# Patient Record
Sex: Female | Born: 1995
Health system: Southern US, Community
[De-identification: ages and names within clinical notes are randomized; demographics above are authoritative.]

## PROBLEM LIST (undated history)

## (undated) DIAGNOSIS — IMO0001 Reserved for inherently not codable concepts without codable children: Secondary | ICD-10-CM

## (undated) DIAGNOSIS — O09299 Supervision of pregnancy with other poor reproductive or obstetric history, unspecified trimester: Secondary | ICD-10-CM

## (undated) HISTORY — DX: Supervision of pregnancy with other poor reproductive or obstetric history, unspecified trimester: O09.299

---
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
## 2015-02-09 NOTE — L&D Delivery Note (Signed)
Delivery Note At 11:33 PM a viable female was delivered via Vaginal, Spontaneous Delivery (Presentation: ;  ).  APGAR: 6, 7; weight 4 lb 6.2 oz (1990 g).   Placenta status: Intact, Spontaneous.  Cord: 3 vessels with the following complications: None.  Cord pH: pending  Anesthesia: None  Episiotomy: None Lacerations: Sulcus, bilateral, with bilateral labial extensions Suture Repair: 3.0 monocryl Est. Blood Loss (mL): 750, IM methergine and SL and 800 micrograms total buccal cytotec   Mom to postpartum. Magnesium sulfate for 24 hours  Baby to NICU.  EURE,LUTHER H 07/14/2015, 12:35 AM

## 2015-02-17 ENCOUNTER — Other Ambulatory Visit (HOSPITAL_COMMUNITY): Payer: Self-pay | Admitting: Nurse Practitioner

## 2015-02-17 DIAGNOSIS — Z369 Encounter for antenatal screening, unspecified: Secondary | ICD-10-CM

## 2015-02-17 LAB — OB RESULTS CONSOLE RPR: RPR: NONREACTIVE

## 2015-02-17 LAB — OB RESULTS CONSOLE HIV ANTIBODY (ROUTINE TESTING): HIV: NONREACTIVE

## 2015-02-17 LAB — OB RESULTS CONSOLE GC/CHLAMYDIA
Chlamydia: NEGATIVE
Gonorrhea: NEGATIVE

## 2015-02-17 LAB — OB RESULTS CONSOLE RUBELLA ANTIBODY, IGM: Rubella: IMMUNE

## 2015-02-17 LAB — OB RESULTS CONSOLE VARICELLA ZOSTER ANTIBODY, IGG: Varicella: IMMUNE

## 2015-02-18 ENCOUNTER — Ambulatory Visit (HOSPITAL_COMMUNITY)
Admission: RE | Admit: 2015-02-18 | Discharge: 2015-02-18 | Disposition: A | Discharge status: Home / Self Care | Payer: Medicaid Other | Source: Ambulatory Visit | Attending: Nurse Practitioner | Admitting: Nurse Practitioner

## 2015-02-18 ENCOUNTER — Ambulatory Visit (HOSPITAL_COMMUNITY): Payer: Medicaid Other

## 2015-02-18 ENCOUNTER — Other Ambulatory Visit (HOSPITAL_COMMUNITY): Payer: Self-pay | Admitting: Nurse Practitioner

## 2015-02-18 ENCOUNTER — Encounter (HOSPITAL_COMMUNITY): Payer: Self-pay

## 2015-02-18 DIAGNOSIS — Z3A13 13 weeks gestation of pregnancy: Secondary | ICD-10-CM

## 2015-02-18 DIAGNOSIS — Z369 Encounter for antenatal screening, unspecified: Secondary | ICD-10-CM

## 2015-02-18 DIAGNOSIS — O34219 Maternal care for unspecified type scar from previous cesarean delivery: Secondary | ICD-10-CM

## 2015-02-18 DIAGNOSIS — Z36 Encounter for antenatal screening of mother: Secondary | ICD-10-CM | POA: Insufficient documentation

## 2015-02-18 HISTORY — DX: Reserved for inherently not codable concepts without codable children: IMO0001

## 2015-02-21 ENCOUNTER — Ambulatory Visit (HOSPITAL_COMMUNITY): Payer: Medicaid Other

## 2015-03-04 ENCOUNTER — Other Ambulatory Visit (HOSPITAL_COMMUNITY): Payer: Self-pay

## 2015-07-02 ENCOUNTER — Inpatient Hospital Stay (HOSPITAL_COMMUNITY)
Admission: AD | Admit: 2015-07-02 | Discharge: 2015-07-02 | Disposition: A | Discharge status: Home / Self Care | Payer: Medicaid Other | Source: Ambulatory Visit | Attending: Obstetrics & Gynecology | Admitting: Obstetrics & Gynecology

## 2015-07-02 ENCOUNTER — Encounter (HOSPITAL_COMMUNITY): Payer: Self-pay | Admitting: *Deleted

## 2015-07-02 DIAGNOSIS — R1110 Vomiting, unspecified: Secondary | ICD-10-CM | POA: Diagnosis present

## 2015-07-02 DIAGNOSIS — O479 False labor, unspecified: Secondary | ICD-10-CM

## 2015-07-02 DIAGNOSIS — R109 Unspecified abdominal pain: Secondary | ICD-10-CM | POA: Diagnosis present

## 2015-07-02 DIAGNOSIS — R740 Nonspecific elevation of levels of transaminase and lactic acid dehydrogenase [LDH]: Secondary | ICD-10-CM

## 2015-07-02 DIAGNOSIS — M549 Dorsalgia, unspecified: Secondary | ICD-10-CM | POA: Insufficient documentation

## 2015-07-02 DIAGNOSIS — O9989 Other specified diseases and conditions complicating pregnancy, childbirth and the puerperium: Secondary | ICD-10-CM

## 2015-07-02 DIAGNOSIS — O26893 Other specified pregnancy related conditions, third trimester: Secondary | ICD-10-CM | POA: Insufficient documentation

## 2015-07-02 DIAGNOSIS — Z3A32 32 weeks gestation of pregnancy: Secondary | ICD-10-CM | POA: Diagnosis not present

## 2015-07-02 DIAGNOSIS — R7401 Elevation of levels of liver transaminase levels: Secondary | ICD-10-CM

## 2015-07-02 LAB — URINALYSIS, ROUTINE W REFLEX MICROSCOPIC
BILIRUBIN URINE: NEGATIVE
Glucose, UA: NEGATIVE mg/dL
HGB URINE DIPSTICK: NEGATIVE
Ketones, ur: NEGATIVE mg/dL
Nitrite: NEGATIVE
PH: 7 (ref 5.0–8.0)
Protein, ur: NEGATIVE mg/dL
SPECIFIC GRAVITY, URINE: 1.025 (ref 1.005–1.030)

## 2015-07-02 LAB — CBC WITH DIFFERENTIAL/PLATELET
BASOS ABS: 0 10*3/uL (ref 0.0–0.1)
Basophils Relative: 0 %
Eosinophils Absolute: 0.1 10*3/uL (ref 0.0–0.7)
Eosinophils Relative: 1 %
HEMATOCRIT: 34.2 % — AB (ref 36.0–46.0)
Hemoglobin: 11.5 g/dL — ABNORMAL LOW (ref 12.0–15.0)
LYMPHS PCT: 17 %
Lymphs Abs: 2.1 10*3/uL (ref 0.7–4.0)
MCH: 31.6 pg (ref 26.0–34.0)
MCHC: 33.6 g/dL (ref 30.0–36.0)
MCV: 94 fL (ref 78.0–100.0)
Monocytes Absolute: 0.6 10*3/uL (ref 0.1–1.0)
Monocytes Relative: 5 %
NEUTROS ABS: 9.8 10*3/uL — AB (ref 1.7–7.7)
NEUTROS PCT: 77 %
Platelets: 151 10*3/uL (ref 150–400)
RBC: 3.64 MIL/uL — AB (ref 3.87–5.11)
RDW: 13.6 % (ref 11.5–15.5)
WBC: 12.7 10*3/uL — AB (ref 4.0–10.5)

## 2015-07-02 LAB — COMPREHENSIVE METABOLIC PANEL
ALT: 47 U/L (ref 14–54)
AST: 67 U/L — AB (ref 15–41)
Albumin: 3.2 g/dL — ABNORMAL LOW (ref 3.5–5.0)
Alkaline Phosphatase: 107 U/L (ref 38–126)
Anion gap: 9 (ref 5–15)
BILIRUBIN TOTAL: 0.6 mg/dL (ref 0.3–1.2)
BUN: 5 mg/dL — AB (ref 6–20)
CHLORIDE: 107 mmol/L (ref 101–111)
CO2: 21 mmol/L — ABNORMAL LOW (ref 22–32)
CREATININE: 0.43 mg/dL — AB (ref 0.44–1.00)
Calcium: 8.8 mg/dL — ABNORMAL LOW (ref 8.9–10.3)
GFR calc Af Amer: >60 mL/min (ref >60)
Glucose, Bld: 84 mg/dL (ref 65–99)
Potassium: 4 mmol/L (ref 3.5–5.1)
Sodium: 137 mmol/L (ref 135–145)
Total Protein: 6.4 g/dL — ABNORMAL LOW (ref 6.5–8.1)

## 2015-07-02 LAB — URINE MICROSCOPIC-ADD ON: RBC / HPF: NONE SEEN RBC/hpf (ref 0–5)

## 2015-07-02 LAB — LIPASE, BLOOD: LIPASE: 14 U/L (ref 11–51)

## 2015-07-02 NOTE — MAU Note (Signed)
Back pain started last night.  Still present and worse this morning.  abd started hurting this morning, baby won't stop moving, belly is hard.  Vomited once.

## 2015-07-02 NOTE — MAU Provider Note (Signed)
MAU HISTORY AND PHYSICAL  Chief Complaint:  Back Pain; Abdominal Pain; and Emesis   Danielle Singleton is a 20 y.o.  G2P0101 with IUP at [redacted]w[redacted]d presenting for Back Pain since 6 pm; Abdominal Pain since 5 am this morning; and Emesis at 7:30 am.   Back pain: for about 15 hours now. She was sleeping when she started feeling the pain. Pain is dull, on both side and radiating down to the back of her thighs. Pain is constant and about 6/10. No alleviating factor.  Abdominal pain: resolved now. Similar to her previous pain but on bothside of her upper abdomen this time. Pain is sharp.  Emesis: one episode this morning. It is yellowish without blood. About one tablespoonful. Hasn't eaten this morning. Says she don't have appetite. She also reports mild nausea Denies fever, shortness of breath, headache, vision changes, dysuria, vaginal discharge, bleeding, gush or leak of fluid.  Baby is moving more since this morning. She goes to [facility] for prenatal care. No concern about pregnancy. Prenatal documents reviewed and no concern.  PMH: negative.   OB History: PIH, C/S at 36 weeks for breach Denies smoking, drinking EtOH or using drugs  Past Medical History  Diagnosis Date  . PIH (pregnancy induced hypertension), previous postpartum condition     Past Surgical History  Procedure Laterality Date  . Cesarean section      History reviewed. No pertinent family history.  Social History  Substance Use Topics  . Smoking status: Never Smoker   . Smokeless tobacco: None  . Alcohol Use: None    No Known Allergies  Prescriptions prior to admission  Medication Sig Dispense Refill Last Dose  . Prenatal Vit-Fe Fumarate-FA (PRENATAL VITAMIN PO) Take by mouth.   Taking    Review of Systems - Negative except for what is mentioned in HPI.  Physical Exam  Blood pressure 126/61, pulse 72, temperature 98.5 F (36.9 C), temperature source Oral, resp. rate 16, weight 189 lb 9.6 oz (86.002 kg), last menstrual  period 11/17/2014. GENERAL: Well-developed, well-nourished female in no acute distress.  LUNGS: Clear to auscultation bilaterally.  HEART: Regular rate and rhythm. ABDOMEN: Soft, nontender, nondistended, gravid.  EXTREMITIES: Nontender, no edema, 2+ distal pulses. Cervical Exam: deferred Presentation: deferred FHT:  140 BPM   Labs: Results for orders placed or performed during the hospital encounter of 07/02/15 (from the past 24 hour(s))  Urinalysis, Routine w reflex microscopic (not at [facility])   Collection Time: 07/02/15  8:20 AM  Result Value Ref Range   Color, Urine YELLOW YELLOW   APPearance CLEAR CLEAR   Specific Gravity, Urine 1.025 1.005 - 1.030   pH 7.0 5.0 - 8.0   Glucose, UA NEGATIVE NEGATIVE mg/dL   Hgb urine dipstick NEGATIVE NEGATIVE   Bilirubin Urine NEGATIVE NEGATIVE   Ketones, ur NEGATIVE NEGATIVE mg/dL   Protein, ur NEGATIVE NEGATIVE mg/dL   Nitrite NEGATIVE NEGATIVE   Leukocytes, UA TRACE (A) NEGATIVE  Urine microscopic-add on   Collection Time: 07/02/15  8:20 AM  Result Value Ref Range   Squamous Epithelial / LPF 6-30 (A) NONE SEEN   WBC, UA 0-5 0 - 5 WBC/hpf   RBC / HPF NONE SEEN 0 - 5 RBC/hpf   Bacteria, UA MANY (A) NONE SEEN   Urine-Other MUCOUS PRESENT     Imaging Studies:  No results found.  Assessment: Danielle Singleton is  20 y.o. G2P0101 at [redacted]w[redacted]d presents with back pain, abdominal pain and one episode of emesis. She at risk for  PIH given history of this with previous pregnancy. However, BP within normal range. She is without severe features. Abdominal pain has resolved now. Labs are negative except for mildly elevated AST to 67 which could also be due to emesis. Others on differential are choleastasis but patient without itching. AlkPhos within normal range as well. Low suspicion for acute hepatitis but can not rule out. Unlikely pancreatitis with normal lipase. UA with many bacteria but also with a lot of squamous cells. No vaginal discharge to think of  vaginosis. She is now without abdominal pain to think of early labor. No vaginal bleeding or gush of fluid either.  Plan: Discharge home with follow up at [facility]. She says she has an appointment on 07/08/2015. Discussed return precautions including worsening of pain, itching, yellowish discoloration of skin, severe emesis, regular contraction, vaginal bleeding, leaking of fluid, decreased fetal movement, fever or feeling sick Hepatitis panel pending at the time of discharge. Advised to keep herself hydrated Recommened tylenol as needed and icy hot for pain  Taye T Gonfa 5/24/20179:08 AM  OB FELLOW MAU DISCHARGE ATTESTATION  I have seen and examined this patient; I agree with above documentation in the resident's note. Symptoms resolved by the time she arrived to [facility]. NST reviewed with Dr. Roselie Awkward. Reactive and OK to d/c. Mild AST elevation, hepatitis panel ordered. No pruritus so have held on ordering bile acids. No other lab values to suggest preE or hellp, and patient normotensive and asymptomatic. No ruq pain to suggest cholecystitis. Elevation is mild, do not think this represents acute hepatitis. Ob f/u next week as scheduled. Given preE and cholestasis return precautions   Desma Maxim, MD 12:33 PM

## 2015-07-02 NOTE — Discharge Instructions (Signed)
It was great seeing you today! We have addressed the following issues today   Abdominal pain: likely from your pregnancy. Doubt any serious thing right now. You can come back if you have worsening of pain, itching, yellowish discoloration of skin, severe headache, vision changes, vaginal bleeding, burning with urination, baby not moving as usual or not able to keep food or fluid down, fever or other concerning symptoms. Please, keep yourself well hydrated. We have done some tests. We will notify your if there is a concerning finding.   Back pain: you can try tylenol. This available over the counter. You can also try icy hot.   Take Care,    Braxton Hicks Contractions Contractions of the uterus can occur throughout pregnancy. Contractions are not always a sign that you are in labor.  WHAT ARE BRAXTON HICKS CONTRACTIONS?  Contractions that occur before labor are called Braxton Hicks contractions, or false labor. Toward the end of pregnancy (32-34 weeks), these contractions can develop more often and may become more forceful. This is not true labor because these contractions do not result in opening (dilatation) and thinning of the cervix. They are sometimes difficult to tell apart from true labor because these contractions can be forceful and people have different pain tolerances. You should not feel embarrassed if you go to the hospital with false labor. Sometimes, the only way to tell if you are in true labor is for your health care provider to look for changes in the cervix. If there are no prenatal problems or other health problems associated with the pregnancy, it is completely safe to be sent home with false labor and await the onset of true labor. HOW CAN YOU TELL THE DIFFERENCE BETWEEN TRUE AND FALSE LABOR? False Labor  The contractions of false labor are usually shorter and not as hard as those of true labor.   The contractions are usually irregular.   The contractions are often felt  in the front of the lower abdomen and in the groin.   The contractions may go away when you walk around or change positions while lying down.   The contractions get weaker and are shorter lasting as time goes on.   The contractions do not usually become progressively stronger, regular, and closer together as with true labor.  True Labor  Contractions in true labor last 30-70 seconds, become very regular, usually become more intense, and increase in frequency.   The contractions do not go away with walking.   The discomfort is usually felt in the top of the uterus and spreads to the lower abdomen and low back.   True labor can be determined by your health care provider with an exam. This will show that the cervix is dilating and getting thinner.  WHAT TO REMEMBER  Keep up with your usual exercises and follow other instructions given by your health care provider.   Take medicines as directed by your health care provider.   Keep your regular prenatal appointments.   Eat and drink lightly if you think you are going into labor.   If Braxton Hicks contractions are making you uncomfortable:   Change your position from lying down or resting to walking, or from walking to resting.   Sit and rest in a tub of warm water.   Drink 2-3 glasses of water. Dehydration may cause these contractions.   Do slow and deep breathing several times an hour.  WHEN SHOULD I SEEK IMMEDIATE MEDICAL CARE? Seek immediate medical care  if:  Your contractions become stronger, more regular, and closer together.   You have fluid leaking or gushing from your vagina.   You have a fever.   You pass blood-tinged mucus.   You have vaginal bleeding.   You have continuous abdominal pain.   You have low back pain that you never had before.   You feel your baby's head pushing down and causing pelvic pressure.   Your baby is not moving as much as it used to.    Back Pain in  Pregnancy Back pain during pregnancy is common. It happens in about half of all pregnancies. It is important for you and your baby that you remain active during your pregnancy.If you feel that back pain is not allowing you to remain active or sleep well, it is time to see your caregiver. Back pain may be caused by several factors related to changes during your pregnancy.Fortunately, unless you had trouble with your back before your pregnancy, the pain is likely to get better after you deliver. Low back pain usually occurs between the fifth and seventh months of pregnancy. It can, however, happen in the first couple months. Factors that increase the risk of back problems include:   Previous back problems.  Injury to your back.  Having twins or multiple births.  A chronic cough.  Stress.  Job-related repetitive motions.  Muscle or spinal disease in the back.  Family history of back problems, ruptured (herniated) discs, or osteoporosis.  Depression, anxiety, and panic attacks. CAUSES   When you are pregnant, your body produces a hormone called relaxin. This hormonemakes the ligaments connecting the low back and pubic bones more flexible. This flexibility allows the baby to be delivered more easily. When your ligaments are loose, your muscles need to work harder to support your back. Soreness in your back can come from tired muscles. Soreness can also come from back tissues that are irritated since they are receiving less support.  As the baby grows, it puts pressure on the nerves and blood vessels in your pelvis. This can cause back pain.  As the baby grows and gets heavier during pregnancy, the uterus pushes the stomach muscles forward and changes your center of gravity. This makes your back muscles work harder to maintain good posture. SYMPTOMS  Lumbar pain during pregnancy Lumbar pain during pregnancy usually occurs at or above the waist in the center of the back. There may be pain  and numbness that radiates into your leg or foot. This is similar to low back pain experienced by non-pregnant women. It usually increases with sitting for long periods of time, standing, or repetitive lifting. Tenderness may also be present in the muscles along your upper back. Posterior pelvic pain during pregnancy Pain in the back of the pelvis is more common than lumbar pain in pregnancy. It is a deep pain felt in your side at the waistline, or across the tailbone (sacrum), or in both places. You may have pain on one or both sides. This pain can also go into the buttocks and backs of the upper thighs. Pubic and groin pain may also be present. The pain does not quickly resolve with rest, and morning stiffness may also be present. Pelvic pain during pregnancy can be brought on by most activities. A high level of fitness before and during pregnancy may or may not prevent this problem. Labor pain is usually 1 to 2 minutes apart, lasts for about 1 minute, and involves a bearing down  feeling or pressure in your pelvis. However, if you are at term with the pregnancy, constant low back pain can be the beginning of early labor, and you should be aware of this. DIAGNOSIS  X-rays of the back should not be done during the first 12 to 14 weeks of the pregnancy and only when absolutely necessary during the rest of the pregnancy. MRIs do not give off radiation and are safe during pregnancy. MRIs also should only be done when absolutely necessary. HOME CARE INSTRUCTIONS  Exercise as directed by your caregiver. Exercise is the most effective way to prevent or manage back pain. If you have a back problem, it is especially important to avoid sports that require sudden body movements. Swimming and walking are great activities.  Do not stand in one place for long periods of time.  Do not wear high heels.  Sit in chairs with good posture. Use a pillow on your lower back if necessary. Make sure your head rests over your  shoulders and is not hanging forward.  Try sleeping on your side, preferably the left side, with a pillow or two between your legs. If you are sore after a night's rest, your bedmay betoo soft.Try placing a board between your mattress and box spring.  Listen to your body when lifting.If you are experiencing pain, ask for help or try bending yourknees more so you can use your leg muscles rather than your back muscles. Squat down when picking up something from the floor. Do not bend over.  Eat a healthy diet. Try to gain weight within your caregiver's recommendations.  Use heat or cold packs 3 to 4 times a day for 15 minutes to help with the pain.  Only take over-the-counter or prescription medicines for pain, discomfort, or fever as directed by your caregiver. Sudden (acute) back pain  Use bed rest for only the most extreme, acute episodes of back pain. Prolonged bed rest over 48 hours will aggravate your condition.  Ice is very effective for acute conditions.  Put ice in a plastic bag.  Place a towel between your skin and the bag.  Leave the ice on for 10 to 20 minutes every 2 hours, or as needed.  Using heat packs for 30 minutes prior to activities is also helpful. Continued back pain See your caregiver if you have continued problems. Your caregiver can help or refer you for appropriate physical therapy. With conditioning, most back problems can be avoided. Sometimes, a more serious issue may be the cause of back pain. You should be seen right away if new problems seem to be developing. Your caregiver may recommend:  A maternity girdle.  An elastic sling.  A back brace.  A massage therapist or acupuncture. SEEK MEDICAL CARE IF:   You are not able to do most of your daily activities, even when taking the pain medicine you were given.  You need a referral to a physical therapist or chiropractor.  You want to try acupuncture. SEEK IMMEDIATE MEDICAL CARE IF:  You develop  numbness, tingling, weakness, or problems with the use of your arms or legs.  You develop severe back pain that is no longer relieved with medicines.  You have a sudden change in bowel or bladder control.  You have increasing pain in other areas of the body.  You develop shortness of breath, dizziness, or fainting.  You develop nausea, vomiting, or sweating.  You have back pain which is similar to labor pains.  You have  back pain along with your water breaking or vaginal bleeding.  You have back pain or numbness that travels down your leg.  Your back pain developed after you fell.  You develop pain on one side of your back. You may have a kidney stone.  You see blood in your urine. You may have a bladder infection or kidney stone.  You have back pain with blisters. You may have shingles. Back pain is fairly common during pregnancy but should not be accepted as just part of the process. Back pain should always be treated as soon as possible. This will make your pregnancy as pleasant as possible.   This information is not intended to replace advice given to you by your health care provider. Make sure you discuss any questions you have with your health care provider.   Document Released: 05/05/2005 Document Revised: 04/19/2011 Document Reviewed: 06/16/2010 Elsevier Interactive Patient Education [employer].

## 2015-07-03 LAB — HEPATITIS PANEL, ACUTE
HCV Ab: <0.1 s/co ratio (ref 0.0–0.9)
Hep A IgM: NEGATIVE
Hep B C IgM: NEGATIVE
Hepatitis B Surface Ag: NEGATIVE

## 2015-07-12 ENCOUNTER — Inpatient Hospital Stay (HOSPITAL_COMMUNITY)
Admission: AD | Admit: 2015-07-12 | Discharge: 2015-07-16 | DRG: 774 | Disposition: A | Discharge status: Home / Self Care | Payer: Medicaid Other | Source: Ambulatory Visit | Attending: Obstetrics & Gynecology | Admitting: Obstetrics & Gynecology

## 2015-07-12 ENCOUNTER — Encounter (HOSPITAL_COMMUNITY): Payer: Self-pay

## 2015-07-12 DIAGNOSIS — R079 Chest pain, unspecified: Secondary | ICD-10-CM | POA: Diagnosis present

## 2015-07-12 DIAGNOSIS — O1420 HELLP syndrome (HELLP), unspecified trimester: Secondary | ICD-10-CM

## 2015-07-12 DIAGNOSIS — Z3A34 34 weeks gestation of pregnancy: Secondary | ICD-10-CM

## 2015-07-12 DIAGNOSIS — O1424 HELLP syndrome, complicating childbirth: Principal | ICD-10-CM | POA: Diagnosis present

## 2015-07-12 DIAGNOSIS — K219 Gastro-esophageal reflux disease without esophagitis: Secondary | ICD-10-CM | POA: Diagnosis present

## 2015-07-12 DIAGNOSIS — O9962 Diseases of the digestive system complicating childbirth: Secondary | ICD-10-CM | POA: Diagnosis present

## 2015-07-12 DIAGNOSIS — R748 Abnormal levels of other serum enzymes: Secondary | ICD-10-CM | POA: Diagnosis present

## 2015-07-12 LAB — URINALYSIS, ROUTINE W REFLEX MICROSCOPIC
BILIRUBIN URINE: NEGATIVE
Glucose, UA: NEGATIVE mg/dL
HGB URINE DIPSTICK: NEGATIVE
KETONES UR: NEGATIVE mg/dL
Nitrite: NEGATIVE
PROTEIN: NEGATIVE mg/dL
Specific Gravity, Urine: <1.005 — ABNORMAL LOW (ref 1.005–1.030)
pH: 7 (ref 5.0–8.0)

## 2015-07-12 LAB — URINE MICROSCOPIC-ADD ON: RBC / HPF: NONE SEEN RBC/hpf (ref 0–5)

## 2015-07-12 NOTE — MAU Note (Signed)
Was seen here wk ago for same symptoms. Pain upper abd, back, chest. Took b/p at home and was 162/102. Denies vag bleeding or d/c. Symptoms started this am and not bad but has gotten worse. Vomited 3 times today. No diarrhea

## 2015-07-12 NOTE — MAU Note (Addendum)
Pt c/o upper abdominal pain that has progressively gotten worse throughout the day. Denies vag bleeding or LOF. +FM States she had some high blood pressures in the office-took at home and was 162/102. Has vomited 3 times day. Denies HA but does have complaints of seeing spots in her vision.

## 2015-07-13 ENCOUNTER — Encounter (HOSPITAL_COMMUNITY): Payer: Self-pay | Admitting: *Deleted

## 2015-07-13 DIAGNOSIS — O9962 Diseases of the digestive system complicating childbirth: Secondary | ICD-10-CM | POA: Diagnosis present

## 2015-07-13 DIAGNOSIS — R748 Abnormal levels of other serum enzymes: Secondary | ICD-10-CM | POA: Diagnosis present

## 2015-07-13 DIAGNOSIS — O1424 HELLP syndrome, complicating childbirth: Secondary | ICD-10-CM | POA: Diagnosis not present

## 2015-07-13 DIAGNOSIS — Z3A34 34 weeks gestation of pregnancy: Secondary | ICD-10-CM | POA: Diagnosis not present

## 2015-07-13 DIAGNOSIS — K219 Gastro-esophageal reflux disease without esophagitis: Secondary | ICD-10-CM | POA: Diagnosis present

## 2015-07-13 DIAGNOSIS — R079 Chest pain, unspecified: Secondary | ICD-10-CM | POA: Diagnosis present

## 2015-07-13 LAB — COMPREHENSIVE METABOLIC PANEL
ALBUMIN: 3 g/dL — AB (ref 3.5–5.0)
ALBUMIN: 3.1 g/dL — AB (ref 3.5–5.0)
ALK PHOS: 143 U/L — AB (ref 38–126)
ALT: 225 U/L — AB (ref 14–54)
ALT: 289 U/L — ABNORMAL HIGH (ref 14–54)
ALT: 305 U/L — AB (ref 14–54)
ALT: 333 U/L — ABNORMAL HIGH (ref 14–54)
ANION GAP: 9 (ref 5–15)
ANION GAP: 9 (ref 5–15)
AST: 252 U/L — ABNORMAL HIGH (ref 15–41)
AST: 288 U/L — AB (ref 15–41)
AST: 355 U/L — ABNORMAL HIGH (ref 15–41)
AST: 538 U/L — ABNORMAL HIGH (ref 15–41)
Albumin: 3.1 g/dL — ABNORMAL LOW (ref 3.5–5.0)
Albumin: 3.2 g/dL — ABNORMAL LOW (ref 3.5–5.0)
Alkaline Phosphatase: 134 U/L — ABNORMAL HIGH (ref 38–126)
Alkaline Phosphatase: 128 U/L — ABNORMAL HIGH (ref 38–126)
Alkaline Phosphatase: 138 U/L — ABNORMAL HIGH (ref 38–126)
Anion gap: 8 (ref 5–15)
Anion gap: 9 (ref 5–15)
BILIRUBIN TOTAL: 0.6 mg/dL (ref 0.3–1.2)
BILIRUBIN TOTAL: 0.9 mg/dL (ref 0.3–1.2)
BUN: 6 mg/dL (ref 6–20)
BUN: 7 mg/dL (ref 6–20)
BUN: 9 mg/dL (ref 6–20)
BUN: 9 mg/dL (ref 6–20)
CALCIUM: 7.5 mg/dL — AB (ref 8.9–10.3)
CHLORIDE: 105 mmol/L (ref 101–111)
CHLORIDE: 105 mmol/L (ref 101–111)
CO2: 19 mmol/L — AB (ref 22–32)
CO2: 20 mmol/L — AB (ref 22–32)
CO2: 21 mmol/L — ABNORMAL LOW (ref 22–32)
CO2: 22 mmol/L (ref 22–32)
CREATININE: 0.49 mg/dL (ref 0.44–1.00)
CREATININE: 0.63 mg/dL (ref 0.44–1.00)
Calcium: 8.5 mg/dL — ABNORMAL LOW (ref 8.9–10.3)
Calcium: 7.9 mg/dL — ABNORMAL LOW (ref 8.9–10.3)
Calcium: 7.2 mg/dL — ABNORMAL LOW (ref 8.9–10.3)
Chloride: 104 mmol/L (ref 101–111)
Chloride: 104 mmol/L (ref 101–111)
Creatinine, Ser: 0.44 mg/dL (ref 0.44–1.00)
Creatinine, Ser: 0.4 mg/dL — ABNORMAL LOW (ref 0.44–1.00)
GFR calc Af Amer: >60 mL/min (ref >60)
GFR calc Af Amer: >60 mL/min (ref >60)
GFR calc Af Amer: >60 mL/min (ref >60)
GFR calc non Af Amer: >60 mL/min (ref >60)
GFR calc non Af Amer: >60 mL/min (ref >60)
GLUCOSE: 133 mg/dL — AB (ref 65–99)
GLUCOSE: 142 mg/dL — AB (ref 65–99)
GLUCOSE: 97 mg/dL (ref 65–99)
Glucose, Bld: 117 mg/dL — ABNORMAL HIGH (ref 65–99)
POTASSIUM: 3.8 mmol/L (ref 3.5–5.1)
POTASSIUM: 4 mmol/L (ref 3.5–5.1)
POTASSIUM: 4.1 mmol/L (ref 3.5–5.1)
Potassium: 3.9 mmol/L (ref 3.5–5.1)
SODIUM: 133 mmol/L — AB (ref 135–145)
SODIUM: 133 mmol/L — AB (ref 135–145)
Sodium: 135 mmol/L (ref 135–145)
Sodium: 134 mmol/L — ABNORMAL LOW (ref 135–145)
TOTAL PROTEIN: 6.2 g/dL — AB (ref 6.5–8.1)
TOTAL PROTEIN: 6.5 g/dL (ref 6.5–8.1)
Total Bilirubin: 1 mg/dL (ref 0.3–1.2)
Total Bilirubin: 0.5 mg/dL (ref 0.3–1.2)
Total Protein: 6.4 g/dL — ABNORMAL LOW (ref 6.5–8.1)
Total Protein: 6.6 g/dL (ref 6.5–8.1)

## 2015-07-13 LAB — DIC (DISSEMINATED INTRAVASCULAR COAGULATION)PANEL
D-Dimer, Quant: 14.01 ug/mL-FEU — ABNORMAL HIGH (ref 0.00–0.50)
Fibrinogen: 426 mg/dL (ref 204–475)
INR: 1.11 (ref 0.00–1.49)
Prothrombin Time: 14.5 seconds (ref 11.6–15.2)

## 2015-07-13 LAB — PREPARE RBC (CROSSMATCH)

## 2015-07-13 LAB — PROTEIN / CREATININE RATIO, URINE
Creatinine, Urine: 35 mg/dL
Total Protein, Urine: <6 mg/dL

## 2015-07-13 LAB — PROTIME-INR
INR: 1.06 (ref 0.00–1.49)
PROTHROMBIN TIME: 14 s (ref 11.6–15.2)

## 2015-07-13 LAB — CBC
HCT: 31.7 % — ABNORMAL LOW (ref 36.0–46.0)
HCT: 35 % — ABNORMAL LOW (ref 36.0–46.0)
HEMATOCRIT: 34.6 % — AB (ref 36.0–46.0)
Hemoglobin: 11.9 g/dL — ABNORMAL LOW (ref 12.0–15.0)
Hemoglobin: 10.8 g/dL — ABNORMAL LOW (ref 12.0–15.0)
Hemoglobin: 12.1 g/dL (ref 12.0–15.0)
MCH: 31.8 pg (ref 26.0–34.0)
MCH: 32.1 pg (ref 26.0–34.0)
MCH: 32.2 pg (ref 26.0–34.0)
MCHC: 34.1 g/dL (ref 30.0–36.0)
MCHC: 34.4 g/dL (ref 30.0–36.0)
MCHC: 34.6 g/dL (ref 30.0–36.0)
MCV: 93.1 fL (ref 78.0–100.0)
MCV: 93.2 fL (ref 78.0–100.0)
MCV: 93.3 fL (ref 78.0–100.0)
PLATELETS: 53 10*3/uL — AB (ref 150–400)
PLATELETS: 80 10*3/uL — AB (ref 150–400)
PLATELETS: 90 10*3/uL — AB (ref 150–400)
RBC: 3.71 MIL/uL — ABNORMAL LOW (ref 3.87–5.11)
RBC: 3.4 MIL/uL — ABNORMAL LOW (ref 3.87–5.11)
RBC: 3.76 MIL/uL — AB (ref 3.87–5.11)
RDW: 13.9 % (ref 11.5–15.5)
RDW: 14 % (ref 11.5–15.5)
RDW: 14.2 % (ref 11.5–15.5)
WBC: 12 10*3/uL — ABNORMAL HIGH (ref 4.0–10.5)
WBC: 13.2 10*3/uL — ABNORMAL HIGH (ref 4.0–10.5)
WBC: 15.9 10*3/uL — AB (ref 4.0–10.5)

## 2015-07-13 LAB — GROUP B STREP BY PCR: Group B strep by PCR: NEGATIVE

## 2015-07-13 LAB — OB RESULTS CONSOLE GBS: STREP GROUP B AG: NEGATIVE

## 2015-07-13 LAB — DIC (DISSEMINATED INTRAVASCULAR COAGULATION) PANEL
APTT: 31 s (ref 24–37)
PLATELETS: 58 10*3/uL — AB (ref 150–400)
SMEAR REVIEW: NONE SEEN

## 2015-07-13 LAB — HEPATITIS A ANTIBODY, TOTAL: HEP A TOTAL AB: POSITIVE — AB

## 2015-07-13 LAB — HEPATITIS B SURFACE ANTIGEN: Hepatitis B Surface Ag: NEGATIVE

## 2015-07-13 LAB — LIPASE, BLOOD: Lipase: 17 U/L (ref 11–51)

## 2015-07-13 LAB — HEPATITIS C ANTIBODY

## 2015-07-13 MED ORDER — DOCUSATE SODIUM 100 MG PO CAPS
100.0000 mg | ORAL_CAPSULE | Freq: Every day | ORAL | Status: DC
  Filled 2015-07-13: qty 1

## 2015-07-13 MED ORDER — LACTATED RINGERS IV SOLN
INTRAVENOUS | Status: DC
  Administered 2015-07-13: 03:00:00 via INTRAVENOUS

## 2015-07-13 MED ORDER — OXYCODONE-ACETAMINOPHEN 5-325 MG PO TABS: 2.0000 | ORAL_TABLET | ORAL | Status: DC | PRN

## 2015-07-13 MED ORDER — FENTANYL CITRATE (PF) 100 MCG/2ML IJ SOLN
100.0000 ug | INTRAMUSCULAR | Status: DC | PRN
  Administered 2015-07-13: 100 ug via INTRAVENOUS
  Filled 2015-07-13: qty 2

## 2015-07-13 MED ORDER — OXYCODONE-ACETAMINOPHEN 5-325 MG PO TABS: 1.0000 | ORAL_TABLET | ORAL | Status: DC | PRN

## 2015-07-13 MED ORDER — CALCIUM CARBONATE ANTACID 500 MG PO CHEW
2.0000 | CHEWABLE_TABLET | ORAL | Status: DC | PRN
  Filled 2015-07-13: qty 2

## 2015-07-13 MED ORDER — LACTATED RINGERS IV SOLN
INTRAVENOUS | Status: DC
  Administered 2015-07-13 (×2): via INTRAVENOUS

## 2015-07-13 MED ORDER — SOD CITRATE-CITRIC ACID 500-334 MG/5ML PO SOLN
30.0000 mL | ORAL | Status: DC | PRN
Start: 2015-07-13 — End: 2015-07-14

## 2015-07-13 MED ORDER — MAGNESIUM SULFATE 50 % IJ SOLN
2.0000 g/h | INTRAVENOUS | Status: DC
  Administered 2015-07-13 – 2015-07-14 (×3): 2 g/h via INTRAVENOUS
  Filled 2015-07-13 (×3): qty 80

## 2015-07-13 MED ORDER — LIDOCAINE HCL (PF) 1 % IJ SOLN
30.0000 mL | INTRAMUSCULAR | Status: AC | PRN
  Administered 2015-07-13: 30 mL via SUBCUTANEOUS
  Filled 2015-07-13: qty 30

## 2015-07-13 MED ORDER — METHYLERGONOVINE MALEATE 0.2 MG/ML IJ SOLN
0.2000 mg | Freq: Once | INTRAMUSCULAR | Status: AC
  Administered 2015-07-13: 0.2 mg via INTRAMUSCULAR

## 2015-07-13 MED ORDER — METHYLERGONOVINE MALEATE 0.2 MG/ML IJ SOLN
INTRAMUSCULAR | Status: AC
Start: 2015-07-13 — End: 2015-07-13
  Administered 2015-07-13: 0.2 mg via INTRAMUSCULAR
  Filled 2015-07-13: qty 1

## 2015-07-13 MED ORDER — PENICILLIN G POTASSIUM 5000000 UNITS IJ SOLR
5.0000 10*6.[IU] | Freq: Once | INTRAVENOUS | Status: AC
  Administered 2015-07-13: 5 10*6.[IU] via INTRAVENOUS
  Filled 2015-07-13: qty 5

## 2015-07-13 MED ORDER — ACETAMINOPHEN 325 MG PO TABS: 650.0000 mg | ORAL_TABLET | ORAL | Status: DC | PRN

## 2015-07-13 MED ORDER — MAGNESIUM SULFATE BOLUS VIA INFUSION
4.0000 g | Freq: Once | INTRAVENOUS | Status: AC
  Administered 2015-07-13: 4 g via INTRAVENOUS
  Filled 2015-07-13: qty 500

## 2015-07-13 MED ORDER — HYDRALAZINE HCL 20 MG/ML IJ SOLN: 10.0000 mg | Freq: Once | INTRAMUSCULAR | Status: DC | PRN

## 2015-07-13 MED ORDER — GI COCKTAIL ~~LOC~~
30.0000 mL | Freq: Once | ORAL | Status: AC
  Administered 2015-07-13: 30 mL via ORAL
  Filled 2015-07-13: qty 30

## 2015-07-13 MED ORDER — FLEET ENEMA 7-19 GM/118ML RE ENEM: 1.0000 | ENEMA | RECTAL | Status: DC | PRN

## 2015-07-13 MED ORDER — MISOPROSTOL 200 MCG PO TABS
800.0000 ug | ORAL_TABLET | Freq: Once | ORAL | Status: AC
  Administered 2015-07-13: 800 ug via BUCCAL

## 2015-07-13 MED ORDER — ONDANSETRON HCL 4 MG/2ML IJ SOLN: 4.0000 mg | Freq: Four times a day (QID) | INTRAMUSCULAR | Status: DC | PRN

## 2015-07-13 MED ORDER — LIDOCAINE HCL (PF) 1 % IJ SOLN
30.0000 mL | INTRAMUSCULAR | Status: AC | PRN
  Administered 2015-07-13: 30 mL via SUBCUTANEOUS
  Filled 2015-07-13 (×2): qty 30

## 2015-07-13 MED ORDER — PROMETHAZINE HCL 25 MG/ML IJ SOLN
25.0000 mg | Freq: Once | INTRAMUSCULAR | Status: AC
  Administered 2015-07-13: 25 mg via INTRAVENOUS
  Filled 2015-07-13: qty 1

## 2015-07-13 MED ORDER — LABETALOL HCL 5 MG/ML IV SOLN: 20.0000 mg | INTRAVENOUS | Status: DC | PRN

## 2015-07-13 MED ORDER — PRENATAL MULTIVITAMIN CH
1.0000 | ORAL_TABLET | Freq: Every day | ORAL | Status: DC
  Filled 2015-07-13: qty 1

## 2015-07-13 MED ORDER — PENICILLIN G POTASSIUM 5000000 UNITS IJ SOLR
2.5000 10*6.[IU] | INTRAVENOUS | Status: DC
  Administered 2015-07-13 (×3): 2.5 10*6.[IU] via INTRAVENOUS
  Filled 2015-07-13 (×10): qty 2.5

## 2015-07-13 MED ORDER — TERBUTALINE SULFATE 1 MG/ML IJ SOLN: 0.2500 mg | Freq: Once | INTRAMUSCULAR | Status: DC | PRN

## 2015-07-13 MED ORDER — OXYTOCIN BOLUS FROM INFUSION
500.0000 mL | INTRAVENOUS | Status: DC
  Administered 2015-07-13: 500 mL via INTRAVENOUS

## 2015-07-13 MED ORDER — ONDANSETRON 4 MG PO TBDP
4.0000 mg | ORAL_TABLET | Freq: Four times a day (QID) | ORAL | Status: DC | PRN
  Administered 2015-07-13: 4 mg via ORAL
  Filled 2015-07-13 (×2): qty 1

## 2015-07-13 MED ORDER — OXYTOCIN 40 UNITS IN LACTATED RINGERS INFUSION - SIMPLE MED
1.0000 m[IU]/min | INTRAVENOUS | Status: DC
  Administered 2015-07-13: 2 m[IU]/min via INTRAVENOUS
  Filled 2015-07-13: qty 1000

## 2015-07-13 MED ORDER — MISOPROSTOL 200 MCG PO TABS
ORAL_TABLET | ORAL | Status: AC
Start: 2015-07-13 — End: 2015-07-13
  Administered 2015-07-13: 800 ug via BUCCAL
  Filled 2015-07-13: qty 4

## 2015-07-13 MED ORDER — SODIUM CHLORIDE 0.9 % IV SOLN: Freq: Once | INTRAVENOUS | Status: DC

## 2015-07-13 MED ORDER — OXYTOCIN 40 UNITS IN LACTATED RINGERS INFUSION - SIMPLE MED: 2.5000 [IU]/h | INTRAVENOUS | Status: DC

## 2015-07-13 MED ORDER — ZOLPIDEM TARTRATE 5 MG PO TABS: 5.0000 mg | ORAL_TABLET | Freq: Every evening | ORAL | Status: DC | PRN

## 2015-07-13 MED ORDER — BETAMETHASONE SOD PHOS & ACET 6 (3-3) MG/ML IJ SUSP
12.0000 mg | INTRAMUSCULAR | Status: DC
  Administered 2015-07-13: 12 mg via INTRAMUSCULAR
  Filled 2015-07-13 (×2): qty 2

## 2015-07-13 MED ORDER — LACTATED RINGERS IV SOLN: 500.0000 mL | INTRAVENOUS | Status: DC | PRN

## 2015-07-13 NOTE — Progress Notes (Signed)
Danielle Singleton is a 20 y.o. G2P0101 at [redacted]w[redacted]d admitted for HELLP  Subjective: Patient's RUQ pain improved.  LFTs increased.   Objective: BP 125/75 mmHg  Pulse 110  Temp(Src) 98.5 F (36.9 C) (Oral)  Resp 18  Ht 5\' 2"  (1.575 m)  Wt 192 lb 6.4 oz (87.272 kg)  BMI 35.18 kg/m2  SpO2 100%  LMP 11/17/2014   Total I/O In: 412.5 [P.O.:120; I.V.:292.5] Out: 350 [Urine:350]  FHT:  FHR: 130s bpm, variability: moderate,  accelerations:  Abscent,  decelerations:  Absent UC:   none SVE:      Labs: Lab Results  Component Value Date   WBC 15.9* 07/13/2015   HGB 11.9* 07/13/2015   HCT 34.6* 07/13/2015   MCV 93.3 07/13/2015   PLT 90* 07/13/2015    Assessment / Plan: Will induce for HELLP.  I discussed with her repeat c/s vs vaginal delivery.  Pt would like to attempt vaginal delivery.  Will continue with q6 hr CBC, CMP.  BMZ in 24 hours if not delivered.  Low dose pitocin for cervical ripening.  Preeclampsia:  on magnesium sulfate Fetal Wellbeing:  Category I Pain Control:   I/D:  Will get GBS for fetus, but will treat with Penicillin due to prematurity. Anticipated MOD:  pending.  STINSON, JACOB JEHIEL 07/13/2015, 6:57 AM

## 2015-07-13 NOTE — Progress Notes (Signed)
Labor Progress Note Danielle Singleton is a 20 y.o. G2P0101 at [redacted]w[redacted]d admitted for IOL due preeclampsia with severe features S: reports 10/10 contraction pain. Denies headache, vision changes or RUQ pain.   O:  BP 133/71 mmHg  Pulse 112  Temp(Src) 98.3 F (36.8 C) (Oral)  Resp 20  Ht 5\' 2"  (1.575 m)  Wt 192 lb 6.4 oz (87.272 kg)  BMI 35.18 kg/m2  SpO2 99%  LMP 11/17/2014 EFM: 140/min varia/variable decels with contraction  CVE: Dilation: 4 Effacement (%): 70 Cervical Position: Middle Station: -1 Presentation: Vertex Exam by:: A. Durene Romans, [facility]    A&P: 20 y.o. G2P0101 [redacted]w[redacted]d admitted for IOL due preeclampsia with severe features. BP within normal range for most of the day. HELLP labs improving #Preeclampsia: continue Mg. S/p BMZ on 6/4 #Labor: continue pit. AROM at 1502. #Pain: as needed fentanyl #FWB: CAT-II with minimal variability and variable decels. Ms. Danielle Singleton and Dr.Eurie aware. #GBS: negative  Mercy Riding, MD 9:03 PM

## 2015-07-13 NOTE — H&P (Signed)
FACULTY PRACTICE ANTEPARTUM ADMISSION HISTORY AND PHYSICAL NOTE  History of Present Illness: Danielle Singleton is a 20 y.o. G2P0101 at [redacted]w[redacted]d  presenting for chest and abdominal pain associated with nausea, vomiting, and high blood pressures. Patient's symptoms started this morning, though she has had previous episodes and was seen in the MAU on 5/24 for similar symptoms. She describes her abdominal pain as a constant 9/10 pain that exists like a band across the upper abdomen. There are no alleviating or worsening factors, and she has not taken any pain medications. She had 3 episodes of non-bloody vomiting today including one time in the MAU when she also saw spots in her vision. Her chest pain is on the left side, does not radiate, and is not worsened by exertion or deep breathing. It occurs every hour and lasts for a few minutes. She denies shortness of breath. The patient was told that she had high blood pressures during a clinic visit last week, and she reports having a measurement of 162/102 at home. She did not have high blood pressures prior to pregnancy or earlier in this pregnancy. She did have preeclampsia during her last pregnancy and is currently taking baby aspirin daily. She denies headaches, leak of fluid, or vaginal bleeding.  Patient reports the fetal movement as active. Patient reports uterine contraction  activity as none. Patient reports  vaginal bleeding as none. Patient describes fluid per vagina as None. Fetal presentation is unsure.  Patient Active Problem List   Diagnosis Date Noted  . Elevated liver enzymes 07/13/2015  . ARM PAIN 09/30/2009  . NEVUS 12/11/2007    Past Medical History  Diagnosis Date  . PIH (pregnancy induced hypertension), previous postpartum condition     Past Surgical History  Procedure Laterality Date  . Cesarean section      OB History  Gravida Para Term Preterm AB SAB TAB Ectopic Multiple Living  2 1 0 1      1    # Outcome Date GA Lbr  Len/2nd Weight Sex Delivery Anes PTL Lv  2 Current           1 Preterm 05/11/10 [redacted]w[redacted]d   F CS-LTranv  N Y      Social History   Social History  . Marital Status: Married    Spouse Name: N/A  . Number of Children: N/A  . Years of Education: N/A   Social History Main Topics  . Smoking status: Never Smoker   . Smokeless tobacco: None  . Alcohol Use: None  . Drug Use: None  . Sexual Activity: Not Asked   Other Topics Concern  . None   Social History Narrative    History reviewed. No pertinent family history.  No Known Allergies  Prescriptions prior to admission  Medication Sig Dispense Refill Last Dose  . Prenatal Vit-Fe Fumarate-FA (PRENATAL VITAMIN PO) Take by mouth.   07/11/2015 at Unknown time    Review of Systems -  Review of Systems  Constitutional: Negative for fever and chills.  Eyes: Positive for blurred vision (spots in vision after vomiting). Negative for double vision.  Respiratory: Negative for cough and shortness of breath.   Cardiovascular: Negative for chest pain and orthopnea.  Gastrointestinal: Positive for abdominal pain. Negative for nausea and vomiting.  Genitourinary: Negative for dysuria, frequency and flank pain.  Musculoskeletal: Negative for myalgias.  Skin: Negative for rash.  Neurological: Negative for dizziness, tingling, weakness and headaches.  Endo/Heme/Allergies: Does not bruise/bleed easily.  Psychiatric/Behavioral: Negative for depression  and suicidal ideas. The patient is not nervous/anxious.      Vitals:  BP 130/74 mmHg  Pulse 75  Temp(Src) 98.7 F (37.1 C)  Resp 18  Ht 5\' 2"  (1.575 m)  Wt 192 lb 6.4 oz (87.272 kg)  BMI 35.18 kg/m2  SpO2 100%  LMP 11/17/2014 Physical Examination: CONSTITUTIONAL: Well-developed, well-nourished female in no acute distress.  HENT:  Normocephalic, atraumatic, External right and left ear normal. Oropharynx is clear and moist EYES: Conjunctivae and EOM are normal. Pupils are equal, round, and  reactive to light. No scleral icterus.  NECK: Normal range of motion, supple, no masses SKIN: Skin is warm and dry. No rash noted. Not diaphoretic. No erythema. No pallor. [place]: Alert and oriented to person, place, and time. Normal reflexes, muscle tone coordination. No cranial nerve deficit noted. PSYCHIATRIC: Normal mood and affect. Normal behavior. Normal judgment and thought content. CARDIOVASCULAR: Normal heart rate noted, regular rhythm RESPIRATORY: Effort and breath sounds normal, no problems with respiration noted ABDOMEN: Soft, nontender, nondistended, gravid. MUSCULOSKELETAL: Normal range of motion. No edema and no tenderness. 2+ distal pulses.  Cervix: Not evaluated Membranes:intact Fetal Monitoring:Baseline: 155 bpm, Variability: Good {> 6 bpm), Accelerations: Reactive and Decelerations: Absent Tocometer: Flat  Labs:  Results for orders placed or performed during the hospital encounter of 07/12/15 (from the past 24 hour(s))  Urinalysis, Routine w reflex microscopic (not at [facility])   Collection Time: 07/12/15 10:38 PM  Result Value Ref Range   Color, Urine YELLOW YELLOW   APPearance CLEAR CLEAR   Specific Gravity, Urine <1.005 (L) 1.005 - 1.030   pH 7.0 5.0 - 8.0   Glucose, UA NEGATIVE NEGATIVE mg/dL   Hgb urine dipstick NEGATIVE NEGATIVE   Bilirubin Urine NEGATIVE NEGATIVE   Ketones, ur NEGATIVE NEGATIVE mg/dL   Protein, ur NEGATIVE NEGATIVE mg/dL   Nitrite NEGATIVE NEGATIVE   Leukocytes, UA TRACE (A) NEGATIVE  Urine microscopic-add on   Collection Time: 07/12/15 10:38 PM  Result Value Ref Range   Squamous Epithelial / LPF 0-5 (A) NONE SEEN   WBC, UA 0-5 0 - 5 WBC/hpf   RBC / HPF NONE SEEN 0 - 5 RBC/hpf   Bacteria, UA RARE (A) NONE SEEN  Protein / creatinine ratio, urine   Collection Time: 07/12/15 10:38 PM  Result Value Ref Range   Creatinine, Urine 35.00 mg/dL   Total Protein, Urine <6 mg/dL   Protein Creatinine Ratio        0.00 - 0.15 mg/mg[Cre]   Comprehensive metabolic panel   Collection Time: 07/13/15 12:54 AM  Result Value Ref Range   Sodium 135 135 - 145 mmol/L   Potassium 4.1 3.5 - 5.1 mmol/L   Chloride 105 101 - 111 mmol/L   CO2 22 22 - 32 mmol/L   Glucose, Bld 97 65 - 99 mg/dL   BUN 9 6 - 20 mg/dL   Creatinine, Ser 0.49 0.44 - 1.00 mg/dL   Calcium 8.5 (L) 8.9 - 10.3 mg/dL   Total Protein 6.4 (L) 6.5 - 8.1 g/dL   Albumin 3.1 (L) 3.5 - 5.0 g/dL   AST 288 (H) 15 - 41 U/L   ALT 225 (H) 14 - 54 U/L   Alkaline Phosphatase 134 (H) 38 - 126 U/L   Total Bilirubin 0.6 0.3 - 1.2 mg/dL   GFR calc non Af Amer >60 >60 mL/min   GFR calc Af Amer >60 >60 mL/min   Anion gap 8 5 - 15  CBC   Collection Time: 07/13/15  12:54 AM  Result Value Ref Range   WBC 15.9 (H) 4.0 - 10.5 K/uL   RBC 3.71 (L) 3.87 - 5.11 MIL/uL   Hemoglobin 11.9 (L) 12.0 - 15.0 g/dL   HCT 34.6 (L) 36.0 - 46.0 %   MCV 93.3 78.0 - 100.0 fL   MCH 32.1 26.0 - 34.0 pg   MCHC 34.4 30.0 - 36.0 g/dL   RDW 13.9 11.5 - 15.5 %   Platelets 90 (L) 150 - 400 K/uL  Lipase, blood   Collection Time: 07/13/15 12:54 AM  Result Value Ref Range   Lipase 17 11 - 51 U/L    Imaging Studies: No results found.  Assessment and Plan: Patient Active Problem List   Diagnosis Date Noted  . Elevated liver enzymes 07/13/2015  . ARM PAIN 09/30/2009  . NEVUS 12/11/2007   #HELLP with mild range blood pressures - Patient with elevated BP mild range and AST/ALT (288/225)with low platelets (90). No signs of hemolysis on CBC.  Glucose in normal which is reassuring against acute fatty liver but this is a high risk diagnosis that was considered.  - Start magnesium 4g bolus, 2g/hr - CMP and CBC q 6 hours - Hepatitis panel was ordered given grossly elevated LFTs - PT/INR to offer supporting evidence against AFLD disease. - Will attempt to get 48 hrs of lung maturity but if clinic situation worsens will need to start labor.   #FWB: Cat I - BMZx2 ordered and given first dose in MAU -  Continuous fetal monitoring  #Mode of Delivery-- Patient confirmed desire for TOLAC.   Caren Macadam, MD [facility], [place]   \

## 2015-07-13 NOTE — Progress Notes (Signed)
Sterile spec exam done and using sterile technique foly bulb inserted thru cervix and inflated with 60cc sterile fluid. Pt and fetus tolerated well.

## 2015-07-13 NOTE — H&P (Signed)
MAU HISTORY AND PHYSICAL  Chief Complaint:  Abdominal Pain; Chest Pain; Back Pain; Nausea; and Hypertension   Danielle Singleton is a 20 y.o.  G2P0101 with IUP at [redacted]w[redacted]d presenting for chest and abdominal pain associated with nausea, vomiting, and high blood pressures. Patient's symptoms started this morning, though she has had previous episodes and was seen in the MAU on 5/24 for similar symptoms. She describes her abdominal pain as a constant 9/10 pain that exists like a band across the upper abdomen. There are no alleviating or worsening factors, and she has not taken any pain medications. She had 3 episodes of non-bloody vomiting today including one time in the MAU when she also saw spots in her vision. Her chest pain is on the left side, does not radiate, and is not worsened by exertion or deep breathing. It occurs every hour and lasts for a few minutes. She denies shortness of breath. The patient was told that she had high blood pressures during a clinic visit last week, and she reports having a measurement of 162/102 at home. She did  have high blood pressures prior to pregnancy or earlier in this pregnancy. She did have preeclampsia during her last pregnancy and is currently taking baby aspirin daily. She denies headaches, leak of fluid, or vaginal bleeding.  Past Medical History  Diagnosis Date  . PIH (pregnancy induced hypertension), previous postpartum condition     Past Surgical History  Procedure Laterality Date  . Cesarean section      History reviewed. No pertinent family history.  Social History  Substance Use Topics  . Smoking status: Never Smoker   . Smokeless tobacco: None  . Alcohol Use: None    No Known Allergies  Prescriptions prior to admission  Medication Sig Dispense Refill Last Dose  . Prenatal Vit-Fe Fumarate-FA (PRENATAL VITAMIN PO) Take by mouth.   07/11/2015 at Unknown time    Review of Systems - Negative except for what is mentioned in HPI.  Physical Exam   Blood pressure 150/84, pulse 78, temperature 98.7 F (37.1 C), resp. rate 18, height 5\' 2"  (1.575 m), weight 87.272 kg (192 lb 6.4 oz), last menstrual period 11/17/2014, SpO2 100 %. GENERAL: Well-developed, well-nourished female. LUNGS: Clear to auscultation bilaterally.  HEART: Regular rate and rhythm. ABDOMEN: Tenderness across upper abdomen worse in the epigastric region, gravid.  EXTREMITIES: Nontender, trace edema. MSK: Chest nontender.   Labs: Results for orders placed or performed during the hospital encounter of 07/12/15 (from the past 24 hour(s))  Urinalysis, Routine w reflex microscopic (not at [facility])   Collection Time: 07/12/15 10:38 PM  Result Value Ref Range   Color, Urine YELLOW YELLOW   APPearance CLEAR CLEAR   Specific Gravity, Urine <1.005 (L) 1.005 - 1.030   pH 7.0 5.0 - 8.0   Glucose, UA NEGATIVE NEGATIVE mg/dL   Hgb urine dipstick NEGATIVE NEGATIVE   Bilirubin Urine NEGATIVE NEGATIVE   Ketones, ur NEGATIVE NEGATIVE mg/dL   Protein, ur NEGATIVE NEGATIVE mg/dL   Nitrite NEGATIVE NEGATIVE   Leukocytes, UA TRACE (A) NEGATIVE  Urine microscopic-add on   Collection Time: 07/12/15 10:38 PM  Result Value Ref Range   Squamous Epithelial / LPF 0-5 (A) NONE SEEN   WBC, UA 0-5 0 - 5 WBC/hpf   RBC / HPF NONE SEEN 0 - 5 RBC/hpf   Bacteria, UA RARE (A) NONE SEEN    Imaging Studies:  No results found.  Assessment: Danielle Singleton is  20 y.o. G2P0101 at [redacted]w[redacted]d presents  with chest and abdominal pain associated with nausea, vomiting, and high blood pressures.  #Upper abdominal pain, nausea, vomiting: Patient has had recurring episodes of these symptoms over the last few weeks. Possible causes include GERD, preeclampsia, acute pancreatitis, and acute hepatitis. Patient is at risk for preeclampsia since she had it in her previous pregnancy.  #HTN: Patient reports being told that she has high BP's in clinic this week. Today, she had BP 150/84 that was 137/68 on repeat 30 minutes  later. She does not have a history of HTN but did have preeclampsia with her first pregnancy. This is likely gestational hypertension or preeclampsia.  #Chest pain: This is likely related to GERD since she also has abdominal pain, nausea, and vomiting. PE is less likely since she does not have tachycardia or hypoxemia and has clear lungs on physical exam. MI is also less likely since the pain is nonexertional, and she does not have a history of cardiac issues.  Plan: -GI cocktail -Preeclampsia work up: CBC, CMP, urine protein/creatinine ratio -Lipase for acute preeclampsia  Milon Score, medical student

## 2015-07-13 NOTE — Progress Notes (Signed)
Patient ID: Danielle Singleton, female   DOB: [birth year], 20 y.o.   MRN: [id number]    07/13/2015 3:08 PM  [redacted]w[redacted]d s/p betamethasone x 1 HELLP syndrome Previous Caesarean section  Blood pressure 137/77, pulse 98, temperature 98.8 F (37.1 C), temperature source Oral, resp. rate 16, height 5\' 2"  (1.575 m), weight 192 lb 6.4 oz (87.272 kg), last menstrual period 11/17/2014, SpO2 100 %.   CMP Latest Ref Rng 07/13/2015 07/13/2015 07/13/2015  Glucose 65 - 99 mg/dL 142(H) 133(H) 97  BUN 6 - 20 mg/dL 7 9 9   Creatinine 0.44 - 1.00 mg/dL 0.63 0.44 0.49  Sodium 135 - 145 mmol/L 133(L) 133(L) 135  Potassium 3.5 - 5.1 mmol/L 3.9 3.8 4.1  Chloride 101 - 111 mmol/L 104 105 105  CO2 22 - 32 mmol/L 20(L) 19(L) 22  Calcium 8.9 - 10.3 mg/dL 7.5(L) 7.9(L) 8.5(L)  Total Protein 6.5 - 8.1 g/dL 6.6 6.2(L) 6.4(L)  Total Bilirubin 0.3 - 1.2 mg/dL 0.5 1.0 0.6  Alkaline Phos 38 - 126 U/L 143(H) 128(H) 134(H)  AST 15 - 41 U/L 355(H) 538(H) 288(H)  ALT 14 - 54 U/L 305(H) 333(H) 225(H)   CBC Latest Ref Rng 07/13/2015 07/13/2015 07/13/2015  WBC 4.0 - 10.5 K/uL - 12.0(H) 15.9(H)  Hemoglobin 12.0 - 15.0 g/dL - 10.8(L) 11.9(L)  Hematocrit 36.0 - 46.0 % - 31.7(L) 34.6(L)  Platelets 150 - 400 K/uL 58(L) 53(L) 90(L)   Pt without complaints No CNS symptoms  cx 3/th/soft/-3 Foley removed  FHR tracing rate 130s reactive with some very mild variables AROM clear fluid  Continue pitocin induction of labor  Anesthesia is aware of pt status and will require GET if Caesarean required, also no epidural  Overall status is surprisingly stable  Continue pitocin induction of labor

## 2015-07-13 NOTE — Progress Notes (Signed)
Danielle Singleton is a 20 y.o. G2P0101 at [redacted]w[redacted]d by ultrasound admitted for induction of labor due to Pre-eclamptic toxemia of pregnancy..  Subjective:   Objective: BP 140/84 mmHg  Pulse 97  Temp(Src) 98.3 F (36.8 C) (Oral)  Resp 16  Ht 5\' 2"  (1.575 m)  Wt 192 lb 6.4 oz (87.272 kg)  BMI 35.18 kg/m2  SpO2 100%  LMP 11/17/2014 I/O last 3 completed shifts: In: 487.5 [P.O.:120; I.V.:367.5] Out: 350 [Urine:350] Total I/O In: 914.5 [P.O.:360; I.V.:304.5; IV Piggyback:250] Out: 800 [Urine:800]  FHT:  FHR: 120's bpm, variability: moderate,  accelerations:  Present,  decelerations:  Absent UC:   occasional SVE:   Dilation: Fingertip Effacement (%): Thick Exam by:: Rockwell Alexandria, CNM  Labs: Lab Results  Component Value Date   WBC 12.0* 07/13/2015   HGB 10.8* 07/13/2015   HCT 31.7* 07/13/2015   MCV 93.2 07/13/2015   PLT 53* 07/13/2015    Assessment / Plan: Induction of labor due to preeclampsia and HELLP,  progressing well on pitocin  Labor: yet to be in labor Preeclampsia:  on magnesium sulfate Fetal Wellbeing:  Category I Pain Control:  Labor support without medications I/D:  n/a Anticipated MOD:  NSVD  Koren Shiver 07/13/2015, 11:36 AM

## 2015-07-14 ENCOUNTER — Encounter (HOSPITAL_COMMUNITY): Payer: Self-pay | Admitting: *Deleted

## 2015-07-14 DIAGNOSIS — Z3A34 34 weeks gestation of pregnancy: Secondary | ICD-10-CM

## 2015-07-14 DIAGNOSIS — O1424 HELLP syndrome, complicating childbirth: Secondary | ICD-10-CM

## 2015-07-14 LAB — COMPREHENSIVE METABOLIC PANEL
ALK PHOS: 118 U/L (ref 38–126)
ALT: 150 U/L — ABNORMAL HIGH (ref 14–54)
ALT: 215 U/L — AB (ref 14–54)
ANION GAP: 10 (ref 5–15)
ANION GAP: 6 (ref 5–15)
AST: 156 U/L — ABNORMAL HIGH (ref 15–41)
AST: 75 U/L — ABNORMAL HIGH (ref 15–41)
Albumin: 2.7 g/dL — ABNORMAL LOW (ref 3.5–5.0)
Albumin: 2.5 g/dL — ABNORMAL LOW (ref 3.5–5.0)
Alkaline Phosphatase: 99 U/L (ref 38–126)
BILIRUBIN TOTAL: 0.7 mg/dL (ref 0.3–1.2)
BUN: 8 mg/dL (ref 6–20)
BUN: 9 mg/dL (ref 6–20)
CALCIUM: 6.6 mg/dL — AB (ref 8.9–10.3)
CHLORIDE: 102 mmol/L (ref 101–111)
CO2: 18 mmol/L — AB (ref 22–32)
CO2: 24 mmol/L (ref 22–32)
CREATININE: 0.45 mg/dL (ref 0.44–1.00)
CREATININE: 0.58 mg/dL (ref 0.44–1.00)
Calcium: 6.3 mg/dL — CL (ref 8.9–10.3)
Chloride: 102 mmol/L (ref 101–111)
Glucose, Bld: 152 mg/dL — ABNORMAL HIGH (ref 65–99)
Glucose, Bld: 107 mg/dL — ABNORMAL HIGH (ref 65–99)
POTASSIUM: 4.1 mmol/L (ref 3.5–5.1)
Potassium: 4.1 mmol/L (ref 3.5–5.1)
SODIUM: 130 mmol/L — AB (ref 135–145)
SODIUM: 132 mmol/L — AB (ref 135–145)
TOTAL PROTEIN: 5.6 g/dL — AB (ref 6.5–8.1)
Total Bilirubin: 0.4 mg/dL (ref 0.3–1.2)
Total Protein: 5.4 g/dL — ABNORMAL LOW (ref 6.5–8.1)

## 2015-07-14 LAB — RPR: RPR: NONREACTIVE

## 2015-07-14 LAB — CBC
HCT: 26.8 % — ABNORMAL LOW (ref 36.0–46.0)
HCT: 32.2 % — ABNORMAL LOW (ref 36.0–46.0)
HEMOGLOBIN: 11 g/dL — AB (ref 12.0–15.0)
Hemoglobin: 9 g/dL — ABNORMAL LOW (ref 12.0–15.0)
MCH: 31.5 pg (ref 26.0–34.0)
MCH: 33 pg (ref 26.0–34.0)
MCHC: 33.6 g/dL (ref 30.0–36.0)
MCHC: 34.2 g/dL (ref 30.0–36.0)
MCV: 93.7 fL (ref 78.0–100.0)
MCV: 96.7 fL (ref 78.0–100.0)
PLATELETS: 94 10*3/uL — AB (ref 150–400)
Platelets: 86 10*3/uL — ABNORMAL LOW (ref 150–400)
RBC: 2.86 MIL/uL — AB (ref 3.87–5.11)
RBC: 3.33 MIL/uL — ABNORMAL LOW (ref 3.87–5.11)
RDW: 14.2 % (ref 11.5–15.5)
RDW: 14.4 % (ref 11.5–15.5)
WBC: 17.9 10*3/uL — ABNORMAL HIGH (ref 4.0–10.5)
WBC: 22.9 10*3/uL — ABNORMAL HIGH (ref 4.0–10.5)

## 2015-07-14 LAB — MAGNESIUM
MAGNESIUM: 5 mg/dL — AB (ref 1.7–2.4)
MAGNESIUM: 5.1 mg/dL — AB (ref 1.7–2.4)

## 2015-07-14 MED ORDER — DIPHENHYDRAMINE HCL 25 MG PO CAPS: 25.0000 mg | ORAL_CAPSULE | Freq: Four times a day (QID) | ORAL | Status: DC | PRN

## 2015-07-14 MED ORDER — ONDANSETRON HCL 4 MG PO TABS: 4.0000 mg | ORAL_TABLET | ORAL | Status: DC | PRN

## 2015-07-14 MED ORDER — COCONUT OIL OIL: 1.0000 "application " | TOPICAL_OIL | Status: DC | PRN

## 2015-07-14 MED ORDER — PRENATAL MULTIVITAMIN CH
1.0000 | ORAL_TABLET | Freq: Every day | ORAL | Status: DC
  Administered 2015-07-14 – 2015-07-15 (×2): 1 via ORAL
  Filled 2015-07-14 (×2): qty 1

## 2015-07-14 MED ORDER — IBUPROFEN 600 MG PO TABS
600.0000 mg | ORAL_TABLET | Freq: Four times a day (QID) | ORAL | Status: DC
  Administered 2015-07-14 – 2015-07-16 (×7): 600 mg via ORAL
  Filled 2015-07-14 (×8): qty 1

## 2015-07-14 MED ORDER — BENZOCAINE-MENTHOL 20-0.5 % EX AERO
1.0000 "application " | INHALATION_SPRAY | CUTANEOUS | Status: DC | PRN
  Administered 2015-07-14: 1 via TOPICAL
  Filled 2015-07-14: qty 56

## 2015-07-14 MED ORDER — LACTATED RINGERS IV SOLN
INTRAVENOUS | Status: DC
  Administered 2015-07-14 (×2): via INTRAVENOUS

## 2015-07-14 MED ORDER — TETANUS-DIPHTH-ACELL PERTUSSIS 5-2.5-18.5 LF-MCG/0.5 IM SUSP: 0.5000 mL | Freq: Once | INTRAMUSCULAR | Status: DC

## 2015-07-14 MED ORDER — ONDANSETRON HCL 4 MG/2ML IJ SOLN: 4.0000 mg | INTRAMUSCULAR | Status: DC | PRN

## 2015-07-14 MED ORDER — SENNOSIDES-DOCUSATE SODIUM 8.6-50 MG PO TABS
2.0000 | ORAL_TABLET | ORAL | Status: DC
  Administered 2015-07-14 – 2015-07-15 (×2): 2 via ORAL
  Filled 2015-07-14 (×2): qty 2

## 2015-07-14 MED ORDER — ZOLPIDEM TARTRATE 5 MG PO TABS: 5.0000 mg | ORAL_TABLET | Freq: Every evening | ORAL | Status: DC | PRN

## 2015-07-14 MED ORDER — WITCH HAZEL-GLYCERIN EX PADS: 1.0000 "application " | MEDICATED_PAD | CUTANEOUS | Status: DC | PRN

## 2015-07-14 MED ORDER — ACETAMINOPHEN 325 MG PO TABS
650.0000 mg | ORAL_TABLET | ORAL | Status: DC | PRN
  Administered 2015-07-14: 650 mg via ORAL
  Filled 2015-07-14: qty 2

## 2015-07-14 MED ORDER — METHYLERGONOVINE MALEATE 0.2 MG PO TABS
0.2000 mg | ORAL_TABLET | ORAL | Status: AC
  Administered 2015-07-14 (×5): 0.2 mg via ORAL
  Filled 2015-07-14 (×5): qty 1

## 2015-07-14 MED ORDER — DIBUCAINE 1 % RE OINT: 1.0000 "application " | TOPICAL_OINTMENT | RECTAL | Status: DC | PRN

## 2015-07-14 MED ORDER — SIMETHICONE 80 MG PO CHEW: 80.0000 mg | CHEWABLE_TABLET | ORAL | Status: DC | PRN

## 2015-07-14 NOTE — Lactation Note (Signed)
This note was copied from a baby's chart. Lactation Consultation Note  Patient Name: Danielle Singleton [id number] Date: 07/14/2015 Reason for consult: Follow-up assessment;NICU baby  NICU baby 83 hours old. Mom called for assistance with pumping. Enc mom to pump every 2-3 hours except for one 5-hour stretch of time at night to sleep. Mom return-demonstrated hand expression with a small amount of colostrum visible. Enc mom to pump every 2-3 hours for 15 followed by hand expression. Mom reports that she only wants to pump while she is in the hospital in order to provide colostrum for the baby while in NICU. Enc mom to take/send EBM to NICU and to offer STS as she is able. Mom given [facility] brochure and is aware of OP/BFS[facility] and [place] phone line assistance after D/C.   Maternal Data Has patient been taught Hand Expression?: Yes Does the patient have breastfeeding experience prior to this delivery?: Yes  Feeding    LATCH Score/Interventions                      Lactation Tools Discussed/Used WIC Program: Yes Pump Review: Setup, frequency, and cleaning;Milk Storage Initiated by:: JW Date initiated:: 07/14/15   Consult Status Consult Status: Follow-up Date: 07/15/15 Follow-up type: In-patient    [name] 07/14/2015, 1:18 PM

## 2015-07-14 NOTE — Lactation Note (Signed)
This note was copied from a baby's chart. Lactation Consultation Note  Patient Name: Danielle Singleton [id number] Date: 07/14/2015 Reason for consult: Initial assessment;NICU baby  NICU baby 23 hours old. Discussed beginning to use DEBP with mom and the importance of starting early. Mom agreeable, but is wanting to get up to the bathroom. Notified patient's nurse, and returned to assist mom with pumping. However, mom has visitor in the room and asked LC to return later. This LC's phone number left on patient's dry erase board, and enc mom to call for [facility] when ready.  Maternal Data    Feeding    LATCH Score/Interventions                      Lactation Tools Discussed/Used     Consult Status Consult Status: Follow-up Date: 07/15/15 Follow-up type: In-patient    [name] 07/14/2015, 11:23 AM

## 2015-07-14 NOTE — Progress Notes (Addendum)
Called to confirm stop time of magnesium. Stop magnesium at 2330 07/14/2015, per Dr. Cyndia Skeeters, Resident. Reviewed patients platelet count 94, continue scheduled motrin as ordered.

## 2015-07-14 NOTE — Plan of Care (Signed)
Problem: Life Cycle: Goal: Risk for postpartum hemorrhage will decrease Outcome: Completed/Met Date Met:  07/14/15 Vaginal bleeding is  WNL at this time.     Problem: Nutritional: Goal: Mother's verbalization of comfort with breastfeeding process will improve Outcome: Not Applicable Date Met:  79/39/68 Infant is in NICU.Parent will be pumping.

## 2015-07-14 NOTE — Progress Notes (Signed)
Post Partum Day 1 Subjective:  Danielle Singleton is a 20 y.o. [id number] [redacted]w[redacted]d s/p spontaneous vaginal delivery.  No acute events overnight.  Pt denies problems with ambulating, nausea or vomiting. Pain is well controlled.  She has not voided and has not had flatus.  Lochia Minimal.  Plan for birth control is undecided.  Method of Feeding: breast and bottle.  Objective: Blood pressure 123/66, pulse 124, temperature 100.5 F (38.1 C), temperature source Oral, resp. rate 18, height 5\' 2"  (1.575 m), weight 83.689 kg (184 lb 8 oz), last menstrual period 11/17/2014, SpO2 100 %, unknown if currently breastfeeding.  Physical Exam:  General: alert, cooperative and no distress Chest: normal WOB Heart: Regular rate Abdomen: +BS, soft, mild TTP (appropriate) DVT Evaluation: No evidence of DVT seen on physical exam. Extremities: no edema   Recent Labs  07/13/15 1756 07/14/15 0038  HGB 12.1 11.0*  HCT 35.0* 32.2*    Assessment/Plan:  ASSESSMENT: Danielle Singleton is a 20 y.o. G2P0202 [redacted]w[redacted]d s/p spontaneous vaginal delivery.  #HELLP Syndrome: AST and ALT are decreasing. Platelets increasing. -Mg 24 hrs postpartum  #Postpartum Hemorrhage: 740mL blood loss during delivery -Methergine 0.2mg  q4h PO  #Postpartum Care Continue routine PP care Breastfeeding support PRN

## 2015-07-15 LAB — CBC
HCT: 22.4 % — ABNORMAL LOW (ref 36.0–46.0)
Hemoglobin: 7.5 g/dL — ABNORMAL LOW (ref 12.0–15.0)
MCH: 32.2 pg (ref 26.0–34.0)
MCHC: 33.5 g/dL (ref 30.0–36.0)
MCV: 96.1 fL (ref 78.0–100.0)
PLATELETS: 100 10*3/uL — AB (ref 150–400)
RBC: 2.33 MIL/uL — AB (ref 3.87–5.11)
RDW: 14.9 % (ref 11.5–15.5)
WBC: 12 10*3/uL — AB (ref 4.0–10.5)

## 2015-07-15 LAB — PREPARE PLATELET PHERESIS
Unit division: 0
Unit division: 0

## 2015-07-15 LAB — COMPREHENSIVE METABOLIC PANEL
ALT: 92 U/L — AB (ref 14–54)
AST: 34 U/L (ref 15–41)
Albumin: 2.3 g/dL — ABNORMAL LOW (ref 3.5–5.0)
Alkaline Phosphatase: 78 U/L (ref 38–126)
Anion gap: 2 — ABNORMAL LOW (ref 5–15)
BILIRUBIN TOTAL: 0.5 mg/dL (ref 0.3–1.2)
BUN: 11 mg/dL (ref 6–20)
CO2: 28 mmol/L (ref 22–32)
CREATININE: 0.4 mg/dL — AB (ref 0.44–1.00)
Calcium: 6.6 mg/dL — ABNORMAL LOW (ref 8.9–10.3)
Chloride: 106 mmol/L (ref 101–111)
Glucose, Bld: 82 mg/dL (ref 65–99)
Potassium: 3.7 mmol/L (ref 3.5–5.1)
Sodium: 136 mmol/L (ref 135–145)
TOTAL PROTEIN: 4.9 g/dL — AB (ref 6.5–8.1)

## 2015-07-15 LAB — RPR: RPR Ser Ql: NONREACTIVE

## 2015-07-15 NOTE — Progress Notes (Signed)
CSW attempted again to meet with MOB, but she was not in her room at this attempt.

## 2015-07-15 NOTE — Lactation Note (Signed)
This note was copied from a baby's chart. Lactation Consultation Note  Patient Name: Danielle Singleton [id number] Date: 07/15/2015 Reason for consult: Follow-up assessment;NICU baby  NICU baby 69 hours old. Mom just finished pumping as this LC entered the room. Mom has about 3 ml of EBM, and states that she has taken EBM to NICU several times. Assisted mom with collected EBM in colostrum container. Mom states that she would like to have a DEBP for home so that she can continue pumping when she leaves the hospital. Mom gave permission to send a BF referral to the [facility] office, and it was faxed over to the [facility] office. Enc mom to continue pumping every 2-3 hours followed by hand expression.  Maternal Data    Feeding Feeding Type: Breast Milk with Formula added Length of feed: 30 min  LATCH Score/Interventions                      Lactation Tools Discussed/Used     Consult Status Consult Status: Follow-up Date: 07/16/15 Follow-up type: In-patient    [name] 07/15/2015, 1:54 PM

## 2015-07-15 NOTE — Progress Notes (Signed)
CSW attempted to meet with MOB in her third floor room to introduce services, offer support, and complete assessment due to baby's admission to NICU, but FOB stated that she was in the bathroom at this time.  CSW will attempt again at a later time.

## 2015-07-16 ENCOUNTER — Encounter (HOSPITAL_COMMUNITY): Payer: Self-pay

## 2015-07-16 DIAGNOSIS — O1420 HELLP syndrome (HELLP), unspecified trimester: Secondary | ICD-10-CM

## 2015-07-16 HISTORY — DX: HELLP syndrome (HELLP), unspecified trimester: O14.20

## 2015-07-16 LAB — TYPE AND SCREEN
ABO/RH(D): O POS
ANTIBODY SCREEN: NEGATIVE
Unit division: 0
Unit division: 0
Unit division: 0
Unit division: 0
Unit division: 0

## 2015-07-16 MED ORDER — ACETAMINOPHEN 325 MG PO TABS: 650.0000 mg | ORAL_TABLET | ORAL | Status: DC | PRN

## 2015-07-16 MED ORDER — IBUPROFEN 600 MG PO TABS: 600.0000 mg | ORAL_TABLET | Freq: Four times a day (QID) | ORAL | Status: DC

## 2015-07-16 NOTE — Progress Notes (Signed)
Discharge teaching complete. Pt understood all information and did not have any questions. Pt has a [facility] appointment at 2:00 to get her breast pump. Baby love nurse called to set up home visit with pt. Pt ambulated out of the hospital and discharged home to family.

## 2015-07-16 NOTE — Discharge Summary (Signed)
OB Discharge Summary     Patient Name: Danielle Singleton DOB: 1995/03/20 MRN: [id number]  Date of admission: 07/12/2015 Delivering MD: Tania Ade H   Date of discharge: 07/16/2015  Admitting diagnosis: 76 WKS, STOMACH PAIN, HIGH BP, NAUSEA Intrauterine pregnancy: [redacted]w[redacted]d     Secondary diagnosis:  Active Problems:   Elevated liver enzymes   Spontaneous vaginal delivery   HELLP syndrome   Postpartum hemorrhage  Additional problems: PPH 750 ml     Discharge diagnosis: Term Pregnancy Delivered                                                                                                Post partum procedures:none  Augmentation: AROM, Pitocin and Foley Balloon  Complications: pph 0000000 ml  Hospital course:  Induction of Labor With Vaginal Delivery   20 y.o. yo G2P0202 at [redacted]w[redacted]d was admitted to the hospital 07/12/2015 for induction of labor.  Indication for induction: hellp syndrome.  Patient had an uncomplicated labor course as follows: Membrane Rupture Time/Date: 3:02 PM ,07/13/2015   Intrapartum Procedures: Episiotomy: None [1]                                         Lacerations:  Sulcus [9]  Patient had delivery of a Viable infant.  Information for the patient's newborn:  Mehar, [place] [id number]  Delivery Method: Vaginal, Spontaneous Delivery (Filed from Delivery Summary)   Treated with Mg. No severe range BPs. LFTs trended to 500s/300s but had fallen to 34/92 day prior to discharge. Platelets steady 90s-100s. Received bmz x1  07/13/2015  Details of delivery can be found in separate delivery note.  Patient had a routine postpartum course. Patient is discharged home 07/16/2015.   Physical exam  Filed Vitals:   07/15/15 1808 07/15/15 2214 07/16/15 0114 07/16/15 0542  BP: 105/63 125/64 109/64 101/65  Pulse: 86 100 78 63  Temp: 98.9 F (37.2 C) 98.5 F (36.9 C) 98.2 F (36.8 C) 98.1 F (36.7 C)  TempSrc: Oral Oral Oral Oral  Resp: 18 20 16 16   Height:      Weight:    183 lb  (83.009 kg)  SpO2: 100% 98% 100% 100%   General: alert, cooperative and no distress Lochia: appropriate Uterine Fundus: firm Incision: N/A DVT Evaluation: No cords or calf tenderness. No significant calf/ankle edema. Labs: Lab Results  Component Value Date   WBC 12.0* 07/15/2015   HGB 7.5* 07/15/2015   HCT 22.4* 07/15/2015   MCV 96.1 07/15/2015   PLT 100* 07/15/2015   CMP Latest Ref Rng 07/15/2015  Glucose 65 - 99 mg/dL 82  BUN 6 - 20 mg/dL 11  Creatinine 0.44 - 1.00 mg/dL 0.40(L)  Sodium 135 - 145 mmol/L 136  Potassium 3.5 - 5.1 mmol/L 3.7  Chloride 101 - 111 mmol/L 106  CO2 22 - 32 mmol/L 28  Calcium 8.9 - 10.3 mg/dL 6.6(L)  Total Protein 6.5 - 8.1 g/dL 4.9(L)  Total Bilirubin 0.3 - 1.2 mg/dL 0.5  Alkaline  Phos 38 - 126 U/L 78  AST 15 - 41 U/L 34  ALT 14 - 54 U/L 92(H)    Discharge instruction: per After Visit Summary and "Baby and Me Booklet".  After visit meds:    Medication List    STOP taking these medications        aspirin 81 MG tablet      TAKE these medications        acetaminophen 325 MG tablet  Commonly known as:  TYLENOL  Take 2 tablets (650 mg total) by mouth every 4 (four) hours as needed (for pain scale < 4).     ibuprofen 600 MG tablet  Commonly known as:  ADVIL,MOTRIN  Take 1 tablet (600 mg total) by mouth every 6 (six) hours.     PRENATAL VITAMIN PO  Take by mouth.        Diet: routine diet  Activity: Advance as tolerated. Pelvic rest for 6 weeks.   Outpatient follow up:6 weeks (Nurse home bp check in 4 days) Follow up Appt:No future appointments. Follow up Visit:No Follow-up on file.  Postpartum contraception: Nexplanon  Newborn Data: Live born female  Birth Weight: 4 lb 6.2 oz (1990 g) APGAR: 6, 7  Baby Feeding: Bottle and Breast Disposition:NICU   07/16/2015 Desma Maxim, MD

## 2015-07-16 NOTE — Discharge Instructions (Signed)
Sndrome de HELLP (HELLP Syndrome) El sndrome de HELLP es un trastorno heptico potencialmente mortal considerado un tipo de preeclampsia grave durante el embarazo. La preeclampsia es un trastorno del embarazo que causa hipertensin arterial y protenas en la orina. Se desarrolla a partir de la semana 63 de [place]. La sigla HELLP significa:  H de Hemolytic anemia: anemia hemoltica, hemlisis (destruccin de las clulas sanguneas).  EL de Elevated Liver enzymes: enzimas hepticas elevadas (signo de daos en el hgado).  LP de Low Platelet count: recuento bajo de plaquetas (clulas sanguneas que ayudan a detener el sangrado). El sndrome de HELLP a menudo se produce sin advertencia y puede ser difcil de Marine scientist. En la [employer], el sndrome de HELLP aparece antes de la semana 35 de [place], pero tambin puede aparecer inmediatamente despus del [place]. Puede ser fatal tanto para la madre como para el beb. CAUSAS Por el momento, se desconoce la causa del sndrome de HELLP. SIGNOS Y SNTOMAS   Dolor de [place].  Visin borrosa.  Dolor en la parte superior derecha del abdomen.  Dolor en el hombro, el cuello y la parte superior del cuerpo.  Fatiga.  Sensacin de [employer].  Convulsiones.  Hinchazn o aumento de [employer]. DIAGNSTICO Para confirmar el diagnstico, se hacen anlisis de [place]. Estos estudios son:  Un hemograma completo.  Un anlisis de las enzimas hepticas (anlisis de la funcin heptica).  Una prueba de la funcin renal.  Mediciones de Youth worker (electrolitos).  Estudios de TEFL teacher. TRATAMIENTO  El tratamiento principal del sndrome de HELLP consiste en que el beb nazca lo antes posible. Esto puede lograrse administrando medicamentos para que las Oceanographer (induccin del trabajo de [place]) o mediante una cesrea.  Antes del parto, puede recibir tratamiento durante un breve perodo con una inyeccin  de sulfato de [place]. Este medicamento reduce las contracciones musculares y bloquea el impulso desde los nervios hacia los msculos, lo que ayuda a evitar las convulsiones.  Tambin pueden usarse medicamentos para bajar y Aeronautical engineer presin arterial. Pueden administrarse corticoides como ayuda para que los pulmones del beb maduren ms rpido.  Su mdico puede recomendarle que tome una aspirina de dosis baja (81mg ) cada da, a fin de ayudar a prevenir la hipertensin durante el embarazo, si est en riesgo de padecer preeclampsia. Puede estar en riesgo de padecer preeclampsia si:  Padeci preeclampsia o eclampsia durante un embarazo anterior.  Su beb no creci segn lo previsto durante un embarazo anterior.  Tuvo un parto prematuro en un embarazo anterior.  Experiment una separacin de la placenta desde el tero (desprendimiento abrupto de la placenta) durante un embarazo anterior.  Perdi un beb en un embarazo anterior.  Est embarazada de ms de un beb.  Padece otras enfermedades (como diabetes o una enfermedad autoinmunitaria).  Es necesario el control y la supervisin mdica de forma continua, tanto de usted como del beb. Esto es vlido durante el [place], el [place] de [place], [place] parto y el perodo posterior al parto (posparto). Si los problemas de sangrado se [place], puede ser necesaria una transfusin de [place]. SOLICITE ATENCIN MDICA DE INMEDIATO SI: Tiene sntomas del sndrome de HELLP durante el embarazo. Puede comunicarse con el servicio de emergencias de su localidad (911 en los [place]) para ir al hospital tan pronto como sea posible. No conduzca por sus propios medios [employer] Financial.   Esta informacin no tiene Marine scientist el consejo del mdico. Asegrese de hacerle al mdico  cualquier pregunta que tenga.   Document Released: 07/14/2007 Document Revised: 01/30/2013 Elsevier Interactive Patient Education 2016 [place] vaginal,  Cuidados posteriores  (Vaginal Delivery, Care After) Siga estas instrucciones durante las prximas semanas. Estas indicaciones para el alta le proporcionan informacin general acerca de cmo deber cuidarse despus del parto. El mdico tambin podr darle instrucciones especficas. El tratamiento ha sido planificado segn las prcticas mdicas actuales, pero en algunos casos pueden ocurrir problemas. Comunquese con el mdico si tiene algn problema o tiene preguntas al volver a su casa.  INSTRUCCIONES PARA EL CUIDADO EN EL HOGAR   Tome slo medicamentos de venta libre o recetados, segn las indicaciones del mdico o del Development worker, international aid.  No beba alcohol, especialmente si est amamantando o toma analgsicos.  No mastique tabaco ni fume.  No consuma drogas.  Contine con un adecuado cuidado perineal. El buen cuidado perineal incluye:  Higienizarse de adelante hacia atrs.  Mantener la zona perineal limpia.  No use tampones ni duchas vaginales hasta que su mdico la autorice.  Dchese, lvese el cabello y tome baos de inmersin segn las indicaciones de su mdico.  Utilice un sostn que le ajuste bien y que brinde buen soporte a sus Glass blower/designer.  Consuma alimentos saludables.  Beba suficiente lquido para Consulting civil engineer orina clara o de color amarillo plido.  Consuma alimentos ricos en fibra como cereales y panes integrales, arroz, frijoles y frutas y verduras frescas todos los [place]. Estos alimentos pueden ayudarla a prevenir o Cytogeneticist.  Siga las recomendaciones de su mdico relacionadas con la reanudacin de actividades como subir escaleras, conducir automviles, levantar objetos, hacer ejercicios o viajar.  Hable con su mdico acerca de reanudar la actividad sexual. Volver a la actividad sexual depende del riesgo de infeccin, la velocidad de la curacin y la comodidad y su deseo de Financial controller.  Trate de que alguien la ayude con las actividades del hogar y con el recin  nacido al menos durante un par de das despus de salir del hospital.  Descanse todo lo que pueda. Trate de descansar o tomar una siesta mientras el beb est durmiendo.  Aumente sus actividades gradualmente.  Cumpla con todas las visitas de control programadas para despus del parto. Es muy importante asistir a todas las Teacher, English as a foreign language de seguimiento. En estas citas, su mdico va a controlarla para asegurarse de que est sanando fsica y emocionalmente. SOLICITE ATENCIN MDICA SI:   Elimina cogulos grandes por la vagina. Guarde algunos cogulos para mostrarle al mdico.  Tiene una secrecin con feo olor que proviene de la vagina.  Tiene dificultad para orinar.  Orina con frecuencia.  Siente dolor al [employer].  Nota un cambio en sus movimientos intestinales.  Aumenta el enrojecimiento, el dolor o la hinchazn en la zona de la incisin vaginal (episiotoma) o el desgarro vaginal.  Tiene pus que drena por la episiotoma o el desgarro vaginal.  La episiotoma o el desgarro vaginal se abren.  Sus [employer] duelen, estn duras o enrojecidas.  Sufre un dolor intenso de [place].  Tiene visin borrosa o ve manchas.  Se siente triste o deprimida.  Tiene pensamientos acerca de lastimarse o daar al recin nacido.  Tiene preguntas acerca de su cuidado personal, el cuidado del recin nacido o acerca de los medicamentos.  Se siente mareada o sufre un desmayo.  Tiene una erupcin.  Tiene nuseas o vmitos.  Usted amamant al beb y no ha tenido su perodo menstrual dentro de las 12 semanas despus de dejar  de amamantar.  No amamanta al beb y no tuvo su perodo menstrual en las ltimas 12 semanas despus del parto.  Tiene fiebre. SOLICITE ATENCIN MDICA DE INMEDIATO SI:   Siente dolor persistente.  Siente dolor en el pecho.  Le falta el aire.  Se desmaya.  Siente dolor en la pierna.  Siente Research scientist (life sciences).  El sangrado vaginal satura dos o ms apsitos en 1  hora.   Esta informacin no tiene Marine scientist el consejo del mdico. Asegrese de hacerle al mdico cualquier pregunta que tenga.   Document Released: 01/25/2005 Document Revised: 10/16/2014 Elsevier Interactive Patient Education [employer].

## 2015-07-25 ENCOUNTER — Ambulatory Visit: Payer: Self-pay

## 2015-07-25 NOTE — Lactation Note (Signed)
This note was copied from a baby's chart. Lactation Consultation Note  Patient Name: Danielle Singleton [id number] Date: 07/25/2015 Reason for consult: Follow-up assessment;NICU baby;Infant < 6lbs   Follow up with mom at infant's bedside. Mom reports she is pumping every 3 hours and getting up to 100cc of EBM/pumping. She denies questions/concerns. Mom reports she has placed infant to breast once and the infant was sleepy. Enc mom to put infant to breast at least daily when she is available to allow the infant to practice BF. Mom voiced understanding. Informed mom that [facility] are available to assist with BF if mom would like.    Maternal Data    Feeding Feeding Type: Breast Milk Nipple Type: Slow - flow Length of feed: 50 min  LATCH Score/Interventions                      Lactation Tools Discussed/Used     Consult Status Consult Status: PRN Follow-up type: Call as needed    Donn Pierini 07/25/2015, 12:13 PM

## 2017-12-22 ENCOUNTER — Ambulatory Visit: Payer: Self-pay | Attending: Family Medicine | Admitting: Family Medicine

## 2017-12-22 ENCOUNTER — Encounter: Payer: Self-pay | Admitting: Family Medicine

## 2017-12-22 VITALS — BP 118/74 | HR 74 | Temp 98.4°F | Ht 64.0 in | Wt 178.6 lb

## 2017-12-22 DIAGNOSIS — J0100 Acute maxillary sinusitis, unspecified: Secondary | ICD-10-CM

## 2017-12-22 DIAGNOSIS — J219 Acute bronchiolitis, unspecified: Secondary | ICD-10-CM

## 2017-12-22 DIAGNOSIS — B9689 Other specified bacterial agents as the cause of diseases classified elsewhere: Secondary | ICD-10-CM | POA: Insufficient documentation

## 2017-12-22 MED ORDER — AZITHROMYCIN 250 MG PO TABS: ORAL_TABLET | ORAL | 0 refills | Status: DC

## 2017-12-22 MED FILL — AZITHROMYCIN 250 MG TABLET: 250 | 5 days supply | Qty: 6 | Fill #0

## 2017-12-22 NOTE — Progress Notes (Signed)
Patient is here as a new patient. Patient stated she have a cough that has been going on for four days with yellow/greenish mucus.   Patient stated she is only taking Pill contraceptive [facility]

## 2017-12-22 NOTE — Progress Notes (Signed)
Subjective:    Patient ID: Danielle Singleton, female    DOB: 26-Mar-1995, 22 y.o.   MRN: [id number]  HPI 22 year old female new to the practice.  Patient with complaint of approximately 4 days of productive cough with yellow to green sputum.  Patient also has had nasal congestion with postnasal drainage and facial pressure.  Patient states that the symptoms have been lasting longer than the cough.  Patient denies fever or chills, no headache or dizziness.  Patient denies sore throat but she has some throat irritation from postnasal drainage.      Patient reports no known drug allergies.  Patient reports family history significant for father with heart disease status post recent heart surgery and both mother and father have hypertension and diabetes.  Patient reports that she does not smoke.  Patient's only past surgery has been a C-section.    Review of Systems  Constitutional: Positive for fatigue. Negative for chills and fever.  HENT: Positive for congestion, postnasal drip, rhinorrhea and sinus pressure. Negative for sinus pain, sore throat and trouble swallowing.        Bilateral ear pressure  Eyes: Positive for discharge (Patient with complaint of watery eyes). Negative for visual disturbance.  Cardiovascular: Positive for chest pain. Negative for palpitations and leg swelling.       Patient reports a few months of occasional substernal chest pain.  Patient denies GERD symptoms  Gastrointestinal: Negative for abdominal pain and nausea.  Genitourinary: Negative for dysuria and frequency.  Musculoskeletal: Negative for arthralgias, gait problem, joint swelling and myalgias.  Neurological: Negative for dizziness and headaches.       Objective:   Physical Exam BP 118/74 (BP Location: Left Arm, Patient Position: Sitting, Cuff Size: Normal)   Pulse 74   Temp 98.4 F (36.9 C) (Oral)   Ht 5\' 4"  (1.626 m)   Wt 178 lb 9.6 oz (81 kg)   LMP 12/21/2017   SpO2 97%   BMI 30.66 kg/m Nurse's notes  and vital signs reviewed General-well-nourished, well-developed female in no acute distress ENT-TMs pink bilateral but with visible landmarks, nares with moderate edema of the nasal turbinates with edema and erythema of the nasal mucosa, patient with posterior pharynx/tonsillar arch edema/erythema.  Patient with tenderness over the maxillary sinuses bilaterally right greater than left. Neck-supple, no lymphadenopathy, no thyromegaly, no carotid bruit Lungs-clear to auscultation bilaterally, no increased work of breathing, mild decreased breath sounds at the lung bases Cardiovascular-regular rate and rhythm        Assessment & Plan:  1. Acute bronchiolitis due to unspecified organism Patient with acute bronchitis and patient is encouraged to obtain an over-the-counter cold/cough medication such as Robitussin-DM but also his medication to thin out mucus.  Rest and remain well-hydrated.  Patient with complaint of some atypical substernal chest pain but denies any GERD symptoms and states that this is been occurring before the onset of her cough.  Order placed for patient to have chest x-ray done at her convenience.  Patient is encouraged to follow-up in the next 1 to 2 weeks if she is not feeling any better. - azithromycin (ZITHROMAX) 250 MG tablet; 2 pills the first day then one daily for 4 days  Dispense: 6 tablet; Refill: 0 - DG Chest 2 View; Future  2. Acute maxillary sinusitis, recurrence not specified Patient with acute maxillary sinusitis and prescription provided for azithromycin which will also cover patient's bronchitis.  Patient is encouraged to rest and remain hydrated.  Take an over-the-counter antihistamine  if needed for continued congestion/postnasal drainage. - azithromycin (ZITHROMAX) 250 MG tablet; 2 pills the first day then one daily for 4 days  Dispense: 6 tablet; Refill: 0  An After Visit Summary was printed and given to the patient.  Return if symptoms worsen or fail to  improve, for schedule for well exam.

## 2018-02-13 ENCOUNTER — Encounter: Payer: Self-pay | Admitting: Family Medicine

## 2018-02-15 ENCOUNTER — Other Ambulatory Visit (HOSPITAL_COMMUNITY): Payer: Self-pay | Admitting: *Deleted

## 2018-02-15 ENCOUNTER — Encounter (HOSPITAL_COMMUNITY): Payer: Self-pay | Admitting: *Deleted

## 2018-02-15 DIAGNOSIS — N644 Mastodynia: Secondary | ICD-10-CM

## 2018-03-14 ENCOUNTER — Ambulatory Visit
Admission: RE | Admit: 2018-03-14 | Discharge: 2018-03-14 | Disposition: A | Discharge status: Home / Self Care | Payer: No Typology Code available for payment source | Source: Ambulatory Visit | Attending: Obstetrics and Gynecology | Admitting: Obstetrics and Gynecology

## 2018-03-14 ENCOUNTER — Encounter (HOSPITAL_COMMUNITY): Payer: Self-pay

## 2018-03-14 ENCOUNTER — Ambulatory Visit (HOSPITAL_COMMUNITY)
Admission: RE | Admit: 2018-03-14 | Discharge: 2018-03-14 | Disposition: A | Discharge status: Home / Self Care | Payer: Self-pay | Source: Ambulatory Visit | Attending: Obstetrics and Gynecology | Admitting: Obstetrics and Gynecology

## 2018-03-14 ENCOUNTER — Encounter (HOSPITAL_COMMUNITY): Payer: Self-pay | Admitting: *Deleted

## 2018-03-14 VITALS — BP 116/72 | Ht 63.0 in | Wt 180.0 lb

## 2018-03-14 DIAGNOSIS — N644 Mastodynia: Secondary | ICD-10-CM

## 2018-03-14 DIAGNOSIS — Z1239 Encounter for other screening for malignant neoplasm of breast: Secondary | ICD-10-CM

## 2018-03-14 NOTE — Progress Notes (Signed)
Complaints of left outer breast and axillary pain x 2 years that comes and goes. Patient rates the pain at a 5 out of 10.  Pap Smear: Pap smear not completed today. Last Pap smear was 02/17/2015 at the [facility] Department and normal. Per patient has no history of an abnormal Pap smear. Patient refused Pap smear today. Patient is scheduled for the free cervical cancer screening at the [facility] for a Pap smear on 04/03/2018 at [place]. Last Pap smear result is in Epic.  Physical exam: Breasts Breasts symmetrical. No skin abnormalities bilateral breasts. No nipple retraction bilateral breasts. No nipple discharge bilateral breasts. No lymphadenopathy. No lumps palpated bilateral breasts. Complaints of left outer and axillary breast pain on exam. Referred patient to the [facility] for a left breast ultrasound. Appointment scheduled for Tuesday, March 14, 2018 at 1010.        Pelvic/Bimanual No Pap smear completed today since patient refused. Patient is scheduled for the free cervical cancer screening at the [facility] for a Pap smear on 04/03/2018 at [place].   Smoking History: Patient has never smoked.  Patient Navigation: Patient education provided. Access to services provided for patient through [facility] program.   Breast and Cervical Cancer Risk Assessment: Patient has no family history of breast cancer, known genetic mutations, or radiation treatment to the chest before age 10. Patient has no history of cervical dysplasia, immunocompromised, or DES exposure in-utero. Breast Cancer risk assessment completed. No breast cancer risk calculated due to patient is less than 55 years old.

## 2018-03-14 NOTE — Patient Instructions (Signed)
Explained breast self awareness with Luan Moore. Patient is due for a Pap smear. Patient refused the Pap smear today. Let her know [facility] will cover Pap smears every 3 years unless has a history of abnormal Pap smears. Patient is scheduled for the free cervical cancer screening at the [facility] for a Pap smear on 04/03/2018 at [place]. Referred patient to the [facility] for a left breast ultrasound. Appointment scheduled for Tuesday, March 14, 2018 at 1010. Patient aware of appointment and will be there. Luan Moore verbalized understanding.  Kallin Henk, Arvil [place], RN 10:11 AM

## 2018-04-03 ENCOUNTER — Ambulatory Visit: Payer: Self-pay

## 2018-08-29 ENCOUNTER — Other Ambulatory Visit: Payer: Self-pay

## 2018-08-29 ENCOUNTER — Ambulatory Visit (HOSPITAL_COMMUNITY)
Admission: EM | Admit: 2018-08-29 | Discharge: 2018-08-29 | Disposition: A | Discharge status: Home / Self Care | Payer: Self-pay | Attending: Family Medicine | Admitting: Family Medicine

## 2018-08-29 ENCOUNTER — Encounter (HOSPITAL_COMMUNITY): Payer: Self-pay | Admitting: Emergency Medicine

## 2018-08-29 DIAGNOSIS — R109 Unspecified abdominal pain: Secondary | ICD-10-CM | POA: Insufficient documentation

## 2018-08-29 LAB — COMPREHENSIVE METABOLIC PANEL
ALT: 27 U/L (ref 0–44)
AST: 23 U/L (ref 15–41)
Albumin: 4.1 g/dL (ref 3.5–5.0)
Alkaline Phosphatase: 55 U/L (ref 38–126)
Anion gap: 8 (ref 5–15)
BUN: 9 mg/dL (ref 6–20)
CO2: 23 mmol/L (ref 22–32)
Calcium: 9.2 mg/dL (ref 8.9–10.3)
Chloride: 104 mmol/L (ref 98–111)
Creatinine, Ser: 0.64 mg/dL (ref 0.44–1.00)
GFR calc Af Amer: >60 mL/min (ref >60)
GFR calc non Af Amer: >60 mL/min (ref >60)
Glucose, Bld: 93 mg/dL (ref 70–99)
Potassium: 3.7 mmol/L (ref 3.5–5.1)
Sodium: 135 mmol/L (ref 135–145)
Total Bilirubin: 0.8 mg/dL (ref 0.3–1.2)
Total Protein: 7.4 g/dL (ref 6.5–8.1)

## 2018-08-29 LAB — POCT URINALYSIS DIP (DEVICE)
Bilirubin Urine: NEGATIVE
Glucose, UA: NEGATIVE mg/dL
Ketones, ur: NEGATIVE mg/dL
Leukocytes,Ua: NEGATIVE
Nitrite: NEGATIVE
Protein, ur: NEGATIVE mg/dL
Specific Gravity, Urine: >=1.03 (ref 1.005–1.030)
Urobilinogen, UA: 0.2 mg/dL (ref 0.0–1.0)
pH: 6 (ref 5.0–8.0)

## 2018-08-29 LAB — CBC
HCT: 40.2 % (ref 36.0–46.0)
Hemoglobin: 13.3 g/dL (ref 12.0–15.0)
MCH: 31.4 pg (ref 26.0–34.0)
MCHC: 33.1 g/dL (ref 30.0–36.0)
MCV: 94.8 fL (ref 80.0–100.0)
Platelets: 243 10*3/uL (ref 150–400)
RBC: 4.24 MIL/uL (ref 3.87–5.11)
RDW: 12.8 % (ref 11.5–15.5)
WBC: 7.5 10*3/uL (ref 4.0–10.5)
nRBC: 0 % (ref 0.0–0.2)

## 2018-08-29 MED ORDER — TAMSULOSIN HCL 0.4 MG PO CAPS: 0.4000 mg | ORAL_CAPSULE | Freq: Every day | ORAL | 0 refills | Status: DC

## 2018-08-29 MED ORDER — IBUPROFEN 800 MG PO TABS: 800.0000 mg | ORAL_TABLET | Freq: Three times a day (TID) | ORAL | 0 refills | Status: DC

## 2018-08-29 MED ORDER — ONDANSETRON 4 MG PO TBDP: 4.0000 mg | ORAL_TABLET | Freq: Three times a day (TID) | ORAL | 0 refills | Status: DC | PRN

## 2018-08-29 NOTE — ED Provider Notes (Signed)
[facility]    CSN: [id number] Arrival date & time: 08/29/18  1423     History   Chief Complaint Chief Complaint  Patient presents with   Back Pain    HPI Danielle Singleton is a 23 y.o. 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  Patient states that yesterday she started to develop some mild back pain in her mid right back.  Over the past 24 hours her pain has worsened and wraps around her right side into her right upper abdomen.  She has had associated nausea, but denies vomiting.  She states that it feels very similar to when her liver was inflamed while pregnant.  Since pregnancy a few years ago she has not followed up on this.  She does not have a PCP.  She denies changes in bowel movements.  She has tolerated oral intake, but has had decreased appetite.  She denies any fall increase in activity or injury to back.  She has taken some Tylenol without significant relief of symptoms.  Denies urinary symptoms of dysuria or increased frequency.  Has noted her urine has become slightly darker.  HPI  Past Medical History:  Diagnosis Date   PIH (pregnancy induced hypertension), previous postpartum condition     Patient Active Problem List   Diagnosis Date Noted   HELLP syndrome 07/16/2015   Postpartum hemorrhage 07/16/2015   Spontaneous vaginal delivery 07/14/2015   Elevated liver enzymes 07/13/2015   ARM PAIN 09/30/2009   NEVUS 12/11/2007    Past Surgical History:  Procedure Laterality Date   CESAREAN SECTION      OB History    Gravida  2   Para  2   Term  0   Preterm  2   AB      Living  2     SAB      TAB      Ectopic      Multiple  0   Live Births  2            Home Medications    Prior to Admission medications   Medication Sig Start Date End Date Taking? Authorizing Provider  ibuprofen (ADVIL) 800 MG tablet Take 1 tablet (800 mg total) by mouth 3 (three) times daily.  08/29/18   Kyrra Prada C, PA-C  ondansetron (ZOFRAN ODT) 4 MG disintegrating tablet Take 1 tablet (4 mg total) by mouth every 8 (eight) hours as needed for nausea or vomiting. 08/29/18   Adriane Gabbert C, PA-C  tamsulosin (FLOMAX) 0.4 MG CAPS capsule Take 1 capsule (0.4 mg total) by mouth daily. 08/29/18   Christon Gallaway, Elesa Hacker, PA-C    Family History Family History  Problem Relation Age of Onset   Diabetes Mother    Hypertension Mother    Diabetes Father    Hypertension Father    CAD Father     Social History Social History   Tobacco Use   Smoking status: Never Smoker   Smokeless tobacco: Never Used  Substance Use Topics   Alcohol use: No   Drug use: No     Allergies   Patient has no known allergies.   Review of Systems Review of Systems  Constitutional: Negative for fever.  Respiratory: Positive for chest tightness. Negative for shortness of breath.   Cardiovascular: Negative for chest pain.  Gastrointestinal: Positive for abdominal pain and nausea. Negative for diarrhea and vomiting.  Genitourinary: Positive for flank pain. Negative for  dysuria, frequency, genital sores, hematuria, menstrual problem, vaginal bleeding, vaginal discharge and vaginal pain.  Musculoskeletal: Positive for back pain and myalgias.  Skin: Negative for rash.  Neurological: Negative for dizziness, light-headedness and headaches.     Physical Exam Triage Vital Signs ED Triage Vitals  Enc Vitals Group     BP      Pulse      Resp      Temp      Temp src      SpO2      Weight      Height      Head Circumference      Peak Flow      Pain Score      Pain Loc      Pain Edu?      Excl. in [place]?    No data found.  Updated Vital Signs BP 124/86 (BP Location: Right Arm)    Pulse 62    Temp 98.4 F (36.9 C) (Oral)    Resp 16    LMP 08/21/2018 (Exact Date)    SpO2 98%   Visual Acuity Right Eye Distance:   Left Eye Distance:   Bilateral Distance:    Right Eye Near:   Left Eye  Near:    Bilateral Near:     Physical Exam Vitals signs and nursing note reviewed.  Constitutional:      General: She is not in acute distress.    Appearance: She is well-developed.  HENT:     Head: Normocephalic and atraumatic.  Eyes:     Conjunctiva/sclera: Conjunctivae normal.  Neck:     Musculoskeletal: Neck supple.  Cardiovascular:     Rate and Rhythm: Normal rate and regular rhythm.     Heart sounds: No murmur.  Pulmonary:     Effort: Pulmonary effort is normal. No respiratory distress.     Breath sounds: Normal breath sounds.     Comments: Breathing comfortably at rest, CTABL, no wheezing, rales or other adventitious sounds auscultated Abdominal:     Palpations: Abdomen is soft.     Tenderness: There is abdominal tenderness.     Comments: Abdomen soft, nondistended, tender to palpation in right upper quadrant, negative Murphy's, negative rebound, negative McBurney's; tenderness wraps around to right flank and posteriorly, no CVA tenderness  Musculoskeletal:     Comments: Nontender to palpation of cervical, thoracic and lumbar spine midline, tenderness throughout right lower thoracic musculature extending to flank  Full active range of motion of upper and lower extremities  Skin:    General: Skin is warm and dry.  Neurological:     Mental Status: She is alert.      UC Treatments / Results  Labs (all labs ordered are listed, but only abnormal results are displayed) Labs Reviewed  POCT URINALYSIS DIP (DEVICE) - Abnormal; Notable for the following components:      Result Value   Hgb urine dipstick LARGE (*)    All other components within normal limits  CBC  COMPREHENSIVE METABOLIC PANEL    EKG   Radiology No results found.  Procedures Procedures (including critical care time)  Medications Ordered in UC Medications - No data to display  Initial Impression / Assessment and Plan / UC Course  I have reviewed the triage vital signs and the nursing  notes.  Pertinent labs & imaging results that were available during my care of the patient were reviewed by me and considered in my medical decision making (see chart for  details).     Large hemoglobin on UA, likely related to currently being on menstrual cycle, possible underlying kidney stone.  Does not have history of kidney stones.  Obtaining CBC and CMP to check liver function given history of HELLP.  Patient was tender to palpation, will treat for muscular cause with ibuprofen.  Zofran as needed for nausea.  To provide tamsulosin for underlying stone, but hemoglobin likely from menstrual cycle.  We will continue to monitor, will call with results of blood work.  Follow-up with PCP.  Follow-up in emergency room if pain progressing or worsening.Discussed strict return precautions. Patient verbalized understanding and is agreeable with plan.  Final Clinical Impressions(s) / UC Diagnoses   Final diagnoses:  Right flank pain     Discharge Instructions     I will contact you with results of blood work regarding your liver  Please take ibuprofen 800 mg every 8 hours to help with pain Begin tamsulosin daily to help pass any stone that may be present May Zofran as needed for nausea  Please follow-up here in the emergency room if pain worsening or changing      ED Prescriptions    Medication Sig Dispense Auth. Provider   ibuprofen (ADVIL) 800 MG tablet Take 1 tablet (800 mg total) by mouth 3 (three) times daily. 21 tablet Duell Holdren C, PA-C   tamsulosin (FLOMAX) 0.4 MG CAPS capsule Take 1 capsule (0.4 mg total) by mouth daily. 7 capsule Deaven Urwin C, PA-C   ondansetron (ZOFRAN ODT) 4 MG disintegrating tablet Take 1 tablet (4 mg total) by mouth every 8 (eight) hours as needed for nausea or vomiting. 20 tablet Jency Schnieders, [place] C, PA-C     Controlled Substance Prescriptions [place] Controlled Substance Registry consulted? Not Applicable   Janith Lima, [place] 08/29/18 1521

## 2018-08-29 NOTE — Discharge Instructions (Addendum)
I will contact you with results of blood work regarding your liver  Please take ibuprofen 800 mg every 8 hours to help with pain Begin tamsulosin daily to help pass any stone that may be present May Zofran as needed for nausea  Please follow-up here in the emergency room if pain worsening or changing

## 2018-08-29 NOTE — ED Triage Notes (Signed)
Pt reports a burning pain in her mid back that radiates around the right side of her lower ribcage to below her right breast that started yesterda.  Pt states it is the same pain she had when she was pregnant three years ago and she was told at the time that she had an "inflamed liver."

## 2018-12-15 LAB — CYTOLOGY - PAP: Pap: NEGATIVE

## 2020-11-04 DIAGNOSIS — O09299 Supervision of pregnancy with other poor reproductive or obstetric history, unspecified trimester: Secondary | ICD-10-CM | POA: Insufficient documentation

## 2020-11-14 ENCOUNTER — Encounter: Payer: Self-pay | Admitting: Obstetrics and Gynecology

## 2020-11-14 ENCOUNTER — Other Ambulatory Visit: Payer: Self-pay

## 2020-11-14 ENCOUNTER — Other Ambulatory Visit (HOSPITAL_COMMUNITY)
Admission: RE | Admit: 2020-11-14 | Discharge: 2020-11-14 | Disposition: A | Discharge status: Home / Self Care | Payer: No Typology Code available for payment source | Source: Ambulatory Visit | Attending: Obstetrics and Gynecology | Admitting: Obstetrics and Gynecology

## 2020-11-14 ENCOUNTER — Ambulatory Visit (INDEPENDENT_AMBULATORY_CARE_PROVIDER_SITE_OTHER): Payer: Self-pay | Admitting: Obstetrics and Gynecology

## 2020-11-14 VITALS — BP 113/59 | HR 72 | Wt 183.0 lb

## 2020-11-14 DIAGNOSIS — O09299 Supervision of pregnancy with other poor reproductive or obstetric history, unspecified trimester: Secondary | ICD-10-CM

## 2020-11-14 DIAGNOSIS — O34219 Maternal care for unspecified type scar from previous cesarean delivery: Secondary | ICD-10-CM | POA: Insufficient documentation

## 2020-11-14 DIAGNOSIS — O0990 Supervision of high risk pregnancy, unspecified, unspecified trimester: Secondary | ICD-10-CM | POA: Insufficient documentation

## 2020-11-14 DIAGNOSIS — Z98891 History of uterine scar from previous surgery: Secondary | ICD-10-CM

## 2020-11-14 DIAGNOSIS — Z23 Encounter for immunization: Secondary | ICD-10-CM

## 2020-11-14 DIAGNOSIS — Z3A13 13 weeks gestation of pregnancy: Secondary | ICD-10-CM

## 2020-11-14 MED ORDER — GOJJI WEIGHT SCALE MISC: 1.0000 | 0 refills | Status: DC | PRN

## 2020-11-14 MED ORDER — ASPIRIN EC 81 MG PO TBEC: 81.0000 mg | DELAYED_RELEASE_TABLET | Freq: Every day | ORAL | 2 refills | Status: DC

## 2020-11-14 MED ORDER — BLOOD PRESSURE KIT DEVI: 1.0000 | 0 refills | Status: DC | PRN

## 2020-11-14 NOTE — Progress Notes (Signed)
Subjective:  Danielle Singleton is a 25 y.o. G3P1102 at [redacted]w[redacted]d being seen today for first OB visit. EDD by LMP. H/O [facility] and HELLP with prior pregnancy. H/O c section at 34 weeks due to breech and SPEC. H/O successful VBAC with last pregnancy. Denies any chronic medical problems or medications. .  She is currently monitored for the following issues for this high-risk pregnancy and has NEVUS; Hx of pre-eclampsia in prior pregnancy, currently pregnant; Supervision of high risk pregnancy, antepartum; H/O: cesarean section; and Hx successful VBAC (vaginal birth after cesarean), currently pregnant on their problem list.  Patient reports no complaints.  Contractions: Not present. Vag. Bleeding: None.  Movement: Present. Denies leaking of fluid.   The following portions of the patient's history were reviewed and updated as appropriate: allergies, current medications, past family history, past medical history, past social history, past surgical history and problem list. Problem list updated.  Objective:   Vitals:   11/14/20 1003  BP: (!) 113/59  Pulse: 72  Weight: 183 lb (83 kg)    Fetal Status: Fetal Heart Rate (bpm): 142   Movement: Present     General:  Alert, oriented and cooperative. Patient is in no acute distress.  Skin: Skin is warm and dry. No rash noted.   Cardiovascular: Normal heart rate noted  Respiratory: Normal respiratory effort, no problems with respiration noted  Abdomen: Soft, gravid, appropriate for gestational age. Pain/Pressure: Absent     Pelvic:  Cervical exam deferred        Extremities: Normal range of motion.  Edema: None  Mental Status: Normal mood and affect. Normal behavior. Normal judgment and thought content.   Urinalysis:      Assessment and Plan:  Pregnancy: G3P1102 at [redacted]w[redacted]d  1. Supervision of high risk pregnancy, antepartum Prenatal care and labs reviewed with pt Genetic testing discussed with pt - Genetic Screening - Culture, OB Urine - Hemoglobin A1c -  CBC/D/Plt+RPR+Rh+ABO+RubIgG... - [place] MFM OB DETAIL +14 WK; Future - GC/Chlamydia probe amp (Castor)not at [facility] - Comp Met (CMET) - Protein / creatinine ratio, urine  2. Hx of pre-eclampsia in prior pregnancy, currently pregnant SPEC and pregnancy reviewed with pt. Will start BASA Serial growth scans as per MFM - aspirin EC 81 MG tablet; Take 1 tablet (81 mg total) by mouth daily. Take after 12 weeks for prevention of preeclampsia later in pregnancy  Dispense: 300 tablet; Refill: 2  3. H/O: cesarean section Stable See # 4  4. Hx successful VBAC (vaginal birth after cesarean), currently pregnant Desires again Will discuss at later OB visit and sign consent  Preterm labor symptoms and general obstetric precautions including but not limited to vaginal bleeding, contractions, leaking of fluid and fetal movement were reviewed in detail with the patient. Please refer to After Visit Summary for other counseling recommendations.  Return in about 4 weeks (around 12/12/2020) for OB visit, face to face, MD only.   Chancy Milroy, MD

## 2020-11-14 NOTE — Patient Instructions (Signed)

## 2020-11-15 LAB — CBC/D/PLT+RPR+RH+ABO+RUBIGG...
Antibody Screen: NEGATIVE
Basophils Absolute: 0 10*3/uL (ref 0.0–0.2)
Basos: 0 %
EOS (ABSOLUTE): 0.1 10*3/uL (ref 0.0–0.4)
Eos: 1 %
HCV Ab: <0.1 s/co ratio (ref 0.0–0.9)
HIV Screen 4th Generation wRfx: NONREACTIVE
Hematocrit: 37.2 % (ref 34.0–46.6)
Hemoglobin: 12.5 g/dL (ref 11.1–15.9)
Hepatitis B Surface Ag: NEGATIVE
Immature Grans (Abs): 0 10*3/uL (ref 0.0–0.1)
Immature Granulocytes: 0 %
Lymphocytes Absolute: 2.4 10*3/uL (ref 0.7–3.1)
Lymphs: 32 %
MCH: 31.9 pg (ref 26.6–33.0)
MCHC: 33.6 g/dL (ref 31.5–35.7)
MCV: 95 fL (ref 79–97)
Monocytes Absolute: 0.4 10*3/uL (ref 0.1–0.9)
Monocytes: 6 %
Neutrophils Absolute: 4.6 10*3/uL (ref 1.4–7.0)
Neutrophils: 61 %
Platelets: 232 10*3/uL (ref 150–450)
RBC: 3.92 x10E6/uL (ref 3.77–5.28)
RDW: 12.5 % (ref 11.7–15.4)
RPR Ser Ql: NONREACTIVE
Rh Factor: POSITIVE
Rubella Antibodies, IGG: 3.05 index (ref >0.99)
WBC: 7.6 10*3/uL (ref 3.4–10.8)

## 2020-11-15 LAB — HEMOGLOBIN A1C
Est. average glucose Bld gHb Est-mCnc: 105 mg/dL
Hgb A1c MFr Bld: 5.3 % (ref 4.8–5.6)

## 2020-11-15 LAB — COMPREHENSIVE METABOLIC PANEL
ALT: 21 IU/L (ref 0–32)
AST: 16 IU/L (ref 0–40)
Albumin/Globulin Ratio: 1.6 (ref 1.2–2.2)
Albumin: 4.4 g/dL (ref 3.9–5.0)
Alkaline Phosphatase: 57 IU/L (ref 44–121)
BUN/Creatinine Ratio: 12 (ref 9–23)
BUN: 5 mg/dL — ABNORMAL LOW (ref 6–20)
Bilirubin Total: 0.3 mg/dL (ref 0.0–1.2)
CO2: 17 mmol/L — ABNORMAL LOW (ref 20–29)
Calcium: 9.2 mg/dL (ref 8.7–10.2)
Chloride: 101 mmol/L (ref 96–106)
Creatinine, Ser: 0.42 mg/dL — ABNORMAL LOW (ref 0.57–1.00)
Globulin, Total: 2.8 g/dL (ref 1.5–4.5)
Glucose: 79 mg/dL (ref 70–99)
Potassium: 3.9 mmol/L (ref 3.5–5.2)
Sodium: 136 mmol/L (ref 134–144)
Total Protein: 7.2 g/dL (ref 6.0–8.5)
eGFR: 137 mL/min/{1.73_m2} (ref >59)

## 2020-11-15 LAB — HCV INTERPRETATION

## 2020-11-16 LAB — URINE CULTURE, OB REFLEX

## 2020-11-16 LAB — CULTURE, OB URINE

## 2020-11-16 LAB — PROTEIN / CREATININE RATIO, URINE
Creatinine, Urine: 95.6 mg/dL
Protein, Ur: 20.3 mg/dL
Protein/Creat Ratio: 212 mg/g creat — ABNORMAL HIGH (ref 0–200)

## 2020-11-17 LAB — GC/CHLAMYDIA PROBE AMP (~~LOC~~) NOT AT ARMC
Chlamydia: NEGATIVE
Comment: NEGATIVE
Comment: NORMAL
Neisseria Gonorrhea: NEGATIVE

## 2020-12-16 ENCOUNTER — Other Ambulatory Visit: Payer: Self-pay

## 2020-12-16 ENCOUNTER — Encounter: Payer: Self-pay | Admitting: Family Medicine

## 2020-12-16 ENCOUNTER — Ambulatory Visit (INDEPENDENT_AMBULATORY_CARE_PROVIDER_SITE_OTHER): Payer: Medicaid Other | Admitting: Family Medicine

## 2020-12-16 VITALS — BP 122/76 | HR 77 | Wt 185.7 lb

## 2020-12-16 DIAGNOSIS — Z3A18 18 weeks gestation of pregnancy: Secondary | ICD-10-CM

## 2020-12-16 DIAGNOSIS — O09299 Supervision of pregnancy with other poor reproductive or obstetric history, unspecified trimester: Secondary | ICD-10-CM

## 2020-12-16 DIAGNOSIS — O0990 Supervision of high risk pregnancy, unspecified, unspecified trimester: Secondary | ICD-10-CM

## 2020-12-16 DIAGNOSIS — O34219 Maternal care for unspecified type scar from previous cesarean delivery: Secondary | ICD-10-CM

## 2020-12-16 NOTE — Patient Instructions (Signed)

## 2020-12-16 NOTE — Progress Notes (Signed)
   Subjective:  Danielle Singleton is a 25 y.o. G3P1102 at [redacted]w[redacted]d being seen today for ongoing prenatal care.  She is currently monitored for the following issues for this high-risk pregnancy and has NEVUS; Hx of pre-eclampsia in prior pregnancy, currently pregnant; Supervision of high risk pregnancy, antepartum; H/O: cesarean section; and Hx successful VBAC (vaginal birth after cesarean), currently pregnant on their problem list.  Patient reports no complaints.  Contractions: Not present. Vag. Bleeding: None.  Movement: Present. Denies leaking of fluid.   The following portions of the patient's history were reviewed and updated as appropriate: allergies, current medications, past family history, past medical history, past social history, past surgical history and problem list. Problem list updated.  Objective:   Vitals:   12/16/20 0901  BP: 122/76  Pulse: 77  Weight: 185 lb 11.2 oz (84.2 kg)    Fetal Status: Fetal Heart Rate (bpm): 143   Movement: Present     General:  Alert, oriented and cooperative. Patient is in no acute distress.  Skin: Skin is warm and dry. No rash noted.   Cardiovascular: Normal heart rate noted  Respiratory: Normal respiratory effort, no problems with respiration noted  Abdomen: Soft, gravid, appropriate for gestational age. Pain/Pressure: Absent     Pelvic: Vag. Bleeding: None     Cervical exam deferred        Extremities: Normal range of motion.  Edema: None  Mental Status: Normal mood and affect. Normal behavior. Normal judgment and thought content.   Urinalysis:      Assessment and Plan:  Pregnancy: G3P1102 at [redacted]w[redacted]d  1. Supervision of high risk pregnancy, antepartum BP and FHR normal AFP today - AFP, Serum, Open Spina Bifida  2. [redacted] weeks gestation of pregnancy  - AFP, Serum, Open Spina Bifida  3. Hx successful VBAC (vaginal birth after cesarean), currently pregnant CS>VBAC in 2017 Desires TOLAC  4. Hx of pre-eclampsia in prior pregnancy, currently  pregnant On ASA BP normal  Preterm labor symptoms and general obstetric precautions including but not limited to vaginal bleeding, contractions, leaking of fluid and fetal movement were reviewed in detail with the patient. Please refer to After Visit Summary for other counseling recommendations.  Return in 4 weeks (on 01/13/2021) for [facility], ob visit.   Clarnce Flock, MD

## 2020-12-18 LAB — AFP, SERUM, OPEN SPINA BIFIDA
AFP MoM: 0.88
AFP Value: 35.1 ng/mL
Gest. Age on Collection Date: 18 weeks
Maternal Age At EDD: 27.6 yr
OSBR Risk 1 IN: 10000
Test Results:: NEGATIVE
Weight: 186 [lb_av]

## 2020-12-22 ENCOUNTER — Other Ambulatory Visit: Payer: Self-pay

## 2020-12-22 ENCOUNTER — Encounter: Payer: Self-pay | Admitting: *Deleted

## 2020-12-22 ENCOUNTER — Ambulatory Visit: Payer: Medicaid Other | Attending: Obstetrics and Gynecology

## 2020-12-22 ENCOUNTER — Other Ambulatory Visit: Payer: Self-pay | Admitting: *Deleted

## 2020-12-22 ENCOUNTER — Ambulatory Visit: Payer: Medicaid Other | Admitting: *Deleted

## 2020-12-22 VITALS — BP 120/52 | HR 76

## 2020-12-22 DIAGNOSIS — O0992 Supervision of high risk pregnancy, unspecified, second trimester: Secondary | ICD-10-CM | POA: Diagnosis not present

## 2020-12-22 DIAGNOSIS — O34219 Maternal care for unspecified type scar from previous cesarean delivery: Secondary | ICD-10-CM | POA: Diagnosis not present

## 2020-12-22 DIAGNOSIS — O0990 Supervision of high risk pregnancy, unspecified, unspecified trimester: Secondary | ICD-10-CM | POA: Insufficient documentation

## 2020-12-22 DIAGNOSIS — Z3689 Encounter for other specified antenatal screening: Secondary | ICD-10-CM

## 2020-12-22 DIAGNOSIS — Z3A19 19 weeks gestation of pregnancy: Secondary | ICD-10-CM

## 2020-12-22 DIAGNOSIS — O09292 Supervision of pregnancy with other poor reproductive or obstetric history, second trimester: Secondary | ICD-10-CM

## 2020-12-22 DIAGNOSIS — O43199 Other malformation of placenta, unspecified trimester: Secondary | ICD-10-CM

## 2021-01-12 ENCOUNTER — Other Ambulatory Visit: Payer: Self-pay

## 2021-01-12 ENCOUNTER — Ambulatory Visit (INDEPENDENT_AMBULATORY_CARE_PROVIDER_SITE_OTHER): Payer: Self-pay | Admitting: Obstetrics and Gynecology

## 2021-01-12 ENCOUNTER — Encounter: Payer: Self-pay | Admitting: Obstetrics and Gynecology

## 2021-01-12 VITALS — BP 112/76 | HR 71 | Wt 188.6 lb

## 2021-01-12 DIAGNOSIS — O0990 Supervision of high risk pregnancy, unspecified, unspecified trimester: Secondary | ICD-10-CM

## 2021-01-12 DIAGNOSIS — O34219 Maternal care for unspecified type scar from previous cesarean delivery: Secondary | ICD-10-CM

## 2021-01-12 DIAGNOSIS — O09299 Supervision of pregnancy with other poor reproductive or obstetric history, unspecified trimester: Secondary | ICD-10-CM

## 2021-01-12 DIAGNOSIS — Z98891 History of uterine scar from previous surgery: Secondary | ICD-10-CM

## 2021-01-12 NOTE — Patient Instructions (Signed)

## 2021-01-12 NOTE — Progress Notes (Signed)
Subjective:  Danielle Singleton is a 25 y.o. G3P1102 at [redacted]w[redacted]d being seen today for ongoing prenatal care.  She is currently monitored for the following issues for this low-risk pregnancy and has NEVUS; Hx of pre-eclampsia in prior pregnancy, currently pregnant; Supervision of high risk pregnancy, antepartum; H/O: cesarean section; and Hx successful VBAC (vaginal birth after cesarean), currently pregnant on their problem list.  Patient reports no complaints.  Contractions: Not present. Vag. Bleeding: None.  Movement: Present. Denies leaking of fluid.   The following portions of the patient's history were reviewed and updated as appropriate: allergies, current medications, past family history, past medical history, past social history, past surgical history and problem list. Problem list updated.  Objective:   Vitals:   01/12/21 1015  BP: 112/76  Pulse: 71  Weight: 188 lb 9.6 oz (85.5 kg)    Fetal Status: Fetal Heart Rate (bpm): 140   Movement: Present     General:  Alert, oriented and cooperative. Patient is in no acute distress.  Skin: Skin is warm and dry. No rash noted.   Cardiovascular: Normal heart rate noted  Respiratory: Normal respiratory effort, no problems with respiration noted  Abdomen: Soft, gravid, appropriate for gestational age. Pain/Pressure: Present     Pelvic:  Cervical exam deferred        Extremities: Normal range of motion.  Edema: None  Mental Status: Normal mood and affect. Normal behavior. Normal judgment and thought content.   Urinalysis:      Assessment and Plan:  Pregnancy: G3P1102 at [redacted]w[redacted]d  1. Supervision of high risk pregnancy, antepartum Stable Glucola next visit  2. Hx successful VBAC (vaginal birth after cesarean), currently pregnant TOLAC papers signed 01/12/21  3. H/O: cesarean section Stable See above  4. Hx of pre-eclampsia in prior pregnancy, currently pregnant BP stable Continue with qd BASA  Preterm labor symptoms and general obstetric  precautions including but not limited to vaginal bleeding, contractions, leaking of fluid and fetal movement were reviewed in detail with the patient. Please refer to After Visit Summary for other counseling recommendations.  Return in about 4 weeks (around 02/09/2021) for OB visit, face to face, any provider, fasting for Glucola.   Chancy Milroy, MD

## 2021-02-03 ENCOUNTER — Ambulatory Visit: Payer: Medicaid Other

## 2021-02-08 NOTE — L&D Delivery Note (Signed)
OB/GYN Faculty Practice Delivery Note ? ?Danielle Singleton is a 27 y.o. [phone] s/p VBAC at [redacted]w[redacted]d She was admitted for elective IOL/TOLAC for planned vaginal breech delivery.  ? ?ROM: 2h 162mith meconium stained fluid ?GBS Status: Negative  ? ?Delivery Date/Time: 05/14/21 at 0323 ? ?Delivery: Called to room and patient was complete and pushing. Double footling breech presentation. With subsequent pushes, both feet and legs delivered. Gentle rotation performed to allow for infant's back to be positioned anteriorly. Body then delivered up to bilateral axillas. Both arms were subsequently released, and the body was guided superiorly allowing for fetal head flexion and subsequent delivery. Infant with spontaneous cry, placed on mother's abdomen, dried and stimulated. Cord clamped x 2 after 1-minute delay and cut by FOB under direct supervision. Cord blood drawn. Placenta delivered spontaneously with gentle cord traction. Fundus firm with massage and Pitocin. TXA given. Labia, perineum, vagina, and cervix were inspected, and no lacerations were noted.  ? ?Placenta: Intact, 3VC - sent to L&D ?Complications: None  ?Lacerations: None  ?EBL: 275 cc ?Analgesia: Epidural  ? ?Infant: Viable female  APGARs 8 and 9  Weight pending  ? ?Post-Placental IUD Insertion Procedure Note ? ?Patient also desired postpartum contraception via post-placental Mirena IUD insertion. ? ?Patient identified, informed consent signed, signed copy in chart, time out was performed.   ? ?- IUD grasped between sterile gloved fingers. Fundus identified through abdominal wall using non-insertion hand. IUD inserted to fundus with bimanual technique. IUD carefully released at the fundus and insertion hand gently removed from vagina. ?  ?Strings trimmed to the level of the introitus. Patient tolerated procedure well. ? ?Lot #: [id number] Date: 05/10/2023 ? ?Patient given post procedure instructions and IUD care card with expiration date.  Patient is  asked to keep IUD strings tucked in her vagina until her postpartum follow up visit in 4-6 weeks. Patient advised to abstain from sexual intercourse and pulling on strings before her follow-up visit. Patient verbalized an understanding of the plan of care and agrees.  ? ?ChVilma MeckelMD ?OB/GYN Fellow, Faculty Practice  ? ? ?

## 2021-02-12 ENCOUNTER — Ambulatory Visit: Payer: Medicaid Other

## 2021-02-12 ENCOUNTER — Ambulatory Visit: Payer: Medicaid Other | Attending: Obstetrics

## 2021-02-16 ENCOUNTER — Other Ambulatory Visit: Payer: Medicaid Other

## 2021-02-16 ENCOUNTER — Encounter: Payer: Medicaid Other | Admitting: Obstetrics and Gynecology

## 2021-02-23 ENCOUNTER — Other Ambulatory Visit: Payer: Self-pay | Admitting: *Deleted

## 2021-02-23 ENCOUNTER — Encounter: Payer: Self-pay | Admitting: *Deleted

## 2021-02-23 ENCOUNTER — Ambulatory Visit: Payer: Medicaid Other | Attending: Obstetrics

## 2021-02-23 ENCOUNTER — Other Ambulatory Visit: Payer: Self-pay

## 2021-02-23 ENCOUNTER — Ambulatory Visit: Payer: Medicaid Other | Admitting: *Deleted

## 2021-02-23 VITALS — BP 110/46 | HR 68

## 2021-02-23 DIAGNOSIS — O09213 Supervision of pregnancy with history of pre-term labor, third trimester: Secondary | ICD-10-CM | POA: Diagnosis not present

## 2021-02-23 DIAGNOSIS — O365930 Maternal care for other known or suspected poor fetal growth, third trimester, not applicable or unspecified: Secondary | ICD-10-CM

## 2021-02-23 DIAGNOSIS — O0990 Supervision of high risk pregnancy, unspecified, unspecified trimester: Secondary | ICD-10-CM

## 2021-02-23 DIAGNOSIS — O43199 Other malformation of placenta, unspecified trimester: Secondary | ICD-10-CM | POA: Diagnosis present

## 2021-02-23 DIAGNOSIS — Z3A28 28 weeks gestation of pregnancy: Secondary | ICD-10-CM | POA: Diagnosis not present

## 2021-02-25 ENCOUNTER — Other Ambulatory Visit: Payer: Self-pay

## 2021-02-25 DIAGNOSIS — O0990 Supervision of high risk pregnancy, unspecified, unspecified trimester: Secondary | ICD-10-CM

## 2021-02-26 ENCOUNTER — Encounter: Payer: Self-pay | Admitting: Obstetrics and Gynecology

## 2021-02-26 ENCOUNTER — Ambulatory Visit (INDEPENDENT_AMBULATORY_CARE_PROVIDER_SITE_OTHER): Payer: Medicaid Other | Admitting: Obstetrics and Gynecology

## 2021-02-26 ENCOUNTER — Other Ambulatory Visit: Payer: Medicaid Other

## 2021-02-26 VITALS — BP 112/72 | HR 77 | Wt 195.7 lb

## 2021-02-26 DIAGNOSIS — O0990 Supervision of high risk pregnancy, unspecified, unspecified trimester: Secondary | ICD-10-CM

## 2021-02-26 DIAGNOSIS — O365990 Maternal care for other known or suspected poor fetal growth, unspecified trimester, not applicable or unspecified: Secondary | ICD-10-CM | POA: Insufficient documentation

## 2021-02-26 DIAGNOSIS — Z98891 History of uterine scar from previous surgery: Secondary | ICD-10-CM

## 2021-02-26 DIAGNOSIS — Z23 Encounter for immunization: Secondary | ICD-10-CM

## 2021-02-26 DIAGNOSIS — O365920 Maternal care for other known or suspected poor fetal growth, second trimester, not applicable or unspecified: Secondary | ICD-10-CM

## 2021-02-26 DIAGNOSIS — O34219 Maternal care for unspecified type scar from previous cesarean delivery: Secondary | ICD-10-CM

## 2021-02-26 DIAGNOSIS — O09299 Supervision of pregnancy with other poor reproductive or obstetric history, unspecified trimester: Secondary | ICD-10-CM

## 2021-02-26 NOTE — Patient Instructions (Signed)

## 2021-02-26 NOTE — Progress Notes (Signed)
Subjective:  Danielle Singleton is a 26 y.o. G3P1102 at [redacted]w[redacted]d being seen today for ongoing prenatal care.  She is currently monitored for the following issues for this high-risk pregnancy and has NEVUS; Hx of pre-eclampsia in prior pregnancy, currently pregnant; Supervision of high risk pregnancy, antepartum; H/O: cesarean section; Hx successful VBAC (vaginal birth after cesarean), currently pregnant; and IUGR (intrauterine growth restriction) affecting care of mother on their problem list.  Patient reports no complaints.  Contractions: Not present. Vag. Bleeding: None.  Movement: Present. Denies leaking of fluid.   The following portions of the patient's history were reviewed and updated as appropriate: allergies, current medications, past family history, past medical history, past social history, past surgical history and problem list. Problem list updated.  Objective:   Vitals:   02/26/21 0827  BP: 112/72  Pulse: 77  Weight: 195 lb 11.2 oz (88.8 kg)    Fetal Status: Fetal Heart Rate (bpm): 148   Movement: Present     General:  Alert, oriented and cooperative. Patient is in no acute distress.  Skin: Skin is warm and dry. No rash noted.   Cardiovascular: Normal heart rate noted  Respiratory: Normal respiratory effort, no problems with respiration noted  Abdomen: Soft, gravid, appropriate for gestational age. Pain/Pressure: Present     Pelvic:  Cervical exam deferred        Extremities: Normal range of motion.  Edema: None  Mental Status: Normal mood and affect. Normal behavior. Normal judgment and thought content.   Urinalysis:      Assessment and Plan:  Pregnancy: G3P1102 at [redacted]w[redacted]d  1. Supervision of high risk pregnancy, antepartum Stable Glucola today - Tdap vaccine greater than or equal to 7yo IM  2. Hx successful VBAC (vaginal birth after cesarean), currently pregnant Consent signed  3. H/O: cesarean section See above  4. Hx of pre-eclampsia in prior pregnancy, currently  pregnant BP stable Continue with qd BASA  5. Poor fetal growth affecting management of mother in second trimester, single or unspecified fetus Serial growth scans and antenatal testing as per MFM  Preterm labor symptoms and general obstetric precautions including but not limited to vaginal bleeding, contractions, leaking of fluid and fetal movement were reviewed in detail with the patient. Please refer to After Visit Summary for other counseling recommendations.  Return in about 2 weeks (around 03/12/2021) for face to face, MD only, OB visit.   Chancy Milroy, MD

## 2021-02-27 LAB — CBC
Hematocrit: 32.9 % — ABNORMAL LOW (ref 34.0–46.6)
Hemoglobin: 11.2 g/dL (ref 11.1–15.9)
MCH: 32.3 pg (ref 26.6–33.0)
MCHC: 34 g/dL (ref 31.5–35.7)
MCV: 95 fL (ref 79–97)
Platelets: 233 10*3/uL (ref 150–450)
RBC: 3.47 x10E6/uL — ABNORMAL LOW (ref 3.77–5.28)
RDW: 12.9 % (ref 11.7–15.4)
WBC: 9 10*3/uL (ref 3.4–10.8)

## 2021-02-27 LAB — GLUCOSE TOLERANCE, 2 HOURS W/ 1HR
Glucose, 1 hour: 159 mg/dL (ref 70–179)
Glucose, 2 hour: 89 mg/dL (ref 70–152)
Glucose, Fasting: 83 mg/dL (ref 70–91)

## 2021-02-27 LAB — RPR: RPR Ser Ql: NONREACTIVE

## 2021-02-27 LAB — HIV ANTIBODY (ROUTINE TESTING W REFLEX): HIV Screen 4th Generation wRfx: NONREACTIVE

## 2021-03-02 ENCOUNTER — Encounter: Payer: Self-pay | Admitting: *Deleted

## 2021-03-02 ENCOUNTER — Ambulatory Visit (HOSPITAL_BASED_OUTPATIENT_CLINIC_OR_DEPARTMENT_OTHER): Payer: Medicaid Other | Admitting: *Deleted

## 2021-03-02 ENCOUNTER — Other Ambulatory Visit: Payer: Self-pay

## 2021-03-02 ENCOUNTER — Ambulatory Visit: Payer: Medicaid Other | Admitting: *Deleted

## 2021-03-02 ENCOUNTER — Ambulatory Visit: Payer: Medicaid Other | Attending: Obstetrics

## 2021-03-02 VITALS — BP 109/60 | HR 83

## 2021-03-02 DIAGNOSIS — O365930 Maternal care for other known or suspected poor fetal growth, third trimester, not applicable or unspecified: Secondary | ICD-10-CM

## 2021-03-02 DIAGNOSIS — Z3A29 29 weeks gestation of pregnancy: Secondary | ICD-10-CM

## 2021-03-02 DIAGNOSIS — O0990 Supervision of high risk pregnancy, unspecified, unspecified trimester: Secondary | ICD-10-CM | POA: Diagnosis present

## 2021-03-02 DIAGNOSIS — O34219 Maternal care for unspecified type scar from previous cesarean delivery: Secondary | ICD-10-CM

## 2021-03-02 DIAGNOSIS — O09213 Supervision of pregnancy with history of pre-term labor, third trimester: Secondary | ICD-10-CM

## 2021-03-02 NOTE — Procedures (Signed)
Danielle Singleton 03-25-1995 [redacted]w[redacted]d  Fetus A Non-Stress Test Interpretation for 03/02/21  Indication: IUGR  Fetal Heart Rate A Mode: External Baseline Rate (A): 140 bpm Variability: Moderate Accelerations: 10 x 10 Decelerations: None Multiple birth?: No  Uterine Activity Mode: Palpation, Toco Contraction Frequency (min): none Resting Tone Palpated: Relaxed  Interpretation (Fetal Testing) Nonstress Test Interpretation: Reactive Overall Impression: Reassuring for gestational age Comments: Dr. Annamaria Boots reviewed tracing.

## 2021-03-09 ENCOUNTER — Ambulatory Visit (HOSPITAL_BASED_OUTPATIENT_CLINIC_OR_DEPARTMENT_OTHER): Payer: Medicaid Other | Admitting: *Deleted

## 2021-03-09 ENCOUNTER — Ambulatory Visit: Payer: Medicaid Other | Attending: Obstetrics

## 2021-03-09 ENCOUNTER — Encounter: Payer: Self-pay | Admitting: *Deleted

## 2021-03-09 ENCOUNTER — Other Ambulatory Visit: Payer: Self-pay

## 2021-03-09 ENCOUNTER — Ambulatory Visit: Payer: Medicaid Other | Admitting: *Deleted

## 2021-03-09 VITALS — BP 125/62 | HR 93

## 2021-03-09 DIAGNOSIS — O09213 Supervision of pregnancy with history of pre-term labor, third trimester: Secondary | ICD-10-CM

## 2021-03-09 DIAGNOSIS — O365930 Maternal care for other known or suspected poor fetal growth, third trimester, not applicable or unspecified: Secondary | ICD-10-CM

## 2021-03-09 DIAGNOSIS — Z3A30 30 weeks gestation of pregnancy: Secondary | ICD-10-CM

## 2021-03-09 DIAGNOSIS — E668 Other obesity: Secondary | ICD-10-CM | POA: Diagnosis not present

## 2021-03-09 DIAGNOSIS — O0990 Supervision of high risk pregnancy, unspecified, unspecified trimester: Secondary | ICD-10-CM

## 2021-03-09 NOTE — Procedures (Signed)
Danielle Singleton 05/16/1995 [redacted]w[redacted]d  Fetus A Non-Stress Test Interpretation for 03/09/21  Indication: IUGR  Fetal Heart Rate A Mode: External Baseline Rate (A): 140 bpm Variability: Moderate Accelerations: 10 x 10 Decelerations: None Multiple birth?: No  Uterine Activity Mode: Palpation, Toco Contraction Frequency (min): none Resting Tone Palpated: Relaxed  Interpretation (Fetal Testing) Nonstress Test Interpretation: Reactive Overall Impression: Reassuring for gestational age Comments: Dr. Annamaria Boots reviewed tracing

## 2021-03-13 ENCOUNTER — Ambulatory Visit (INDEPENDENT_AMBULATORY_CARE_PROVIDER_SITE_OTHER): Payer: Medicaid Other | Admitting: Obstetrics and Gynecology

## 2021-03-13 ENCOUNTER — Other Ambulatory Visit: Payer: Self-pay

## 2021-03-13 VITALS — BP 115/60 | HR 90 | Wt 200.6 lb

## 2021-03-13 DIAGNOSIS — O34219 Maternal care for unspecified type scar from previous cesarean delivery: Secondary | ICD-10-CM

## 2021-03-13 DIAGNOSIS — O365930 Maternal care for other known or suspected poor fetal growth, third trimester, not applicable or unspecified: Secondary | ICD-10-CM

## 2021-03-13 DIAGNOSIS — O09299 Supervision of pregnancy with other poor reproductive or obstetric history, unspecified trimester: Secondary | ICD-10-CM

## 2021-03-13 DIAGNOSIS — Z3A30 30 weeks gestation of pregnancy: Secondary | ICD-10-CM

## 2021-03-13 NOTE — Progress Notes (Signed)
° °  PRENATAL VISIT NOTE  Subjective:  Danielle Singleton is a 26 y.o. G3P1102 at [redacted]w[redacted]d being seen today for ongoing prenatal care.  She is currently monitored for the following issues for this high-risk pregnancy and has NEVUS; Hx of pre-eclampsia in prior pregnancy, currently pregnant; Supervision of high risk pregnancy, antepartum; Hx successful VBAC (vaginal birth after cesarean), currently pregnant; and IUGR (intrauterine growth restriction) affecting care of mother on their problem list.  Patient reports no complaints.  Contractions: Not present. Vag. Bleeding: None.  Movement: Present. Denies leaking of fluid.   The following portions of the patient's history were reviewed and updated as appropriate: allergies, current medications, past family history, past medical history, past social history, past surgical history and problem list.   Objective:   Vitals:   03/13/21 1039  BP: 115/60  Pulse: 90  Weight: 200 lb 9.6 oz (91 kg)    Fetal Status: Fetal Heart Rate (bpm): 148   Movement: Present     General:  Alert, oriented and cooperative. Patient is in no acute distress.  Skin: Skin is warm and dry. No rash noted.   Cardiovascular: Normal heart rate noted  Respiratory: Normal respiratory effort, no problems with respiration noted  Abdomen: Soft, gravid, appropriate for gestational age.  Pain/Pressure: Present     Pelvic: Cervical exam deferred        Extremities: Normal range of motion.  Edema: None  Mental Status: Normal mood and affect. Normal behavior. Normal judgment and thought content.   Assessment and Plan:  Pregnancy: G3P1102 at [redacted]w[redacted]d 1. [redacted] weeks gestation of pregnancy LNG IUD or OCPs  2. Poor fetal growth affecting management of mother in third trimester, single or unspecified fetus Continue with qwk MFM u/s. I told her that these will determine delivery timing. [facility] precautions. Has rpt growth, dopplers on 2/6 1/30: UA dopplers wnl, afi 10.6 1/16: 8%, 1035gm, ac 28%  3. Hx  successful VBAC (vaginal birth after cesarean), currently pregnant Desires another tolace. Consent signed 12/15  4. Hx of pre-eclampsia in prior pregnancy, currently pregnant Continue low dose asa  Preterm labor symptoms and general obstetric precautions including but not limited to vaginal bleeding, contractions, leaking of fluid and fetal movement were reviewed in detail with the patient. Please refer to After Visit Summary for other counseling recommendations.   Return in 10 days (on 03/23/2021) for in person, high risk ob, md visit.  Future Appointments  Date Time Provider [place]  03/13/2021 11:15 AM Aletha Halim, MD [facility]  03/16/2021  1:15 PM [facility] NURSE [facility]  03/16/2021  1:30 PM [facility] US3 [facility]  03/16/2021  2:15 PM [facility] NST [facility]  03/23/2021  1:35 PM Anyanwu, Sallyanne Havers, MD [facility]  03/23/2021  2:30 PM [facility] NURSE [facility]  03/23/2021  2:45 PM [facility] US5 [facility]    Aletha Halim, MD

## 2021-03-13 NOTE — Progress Notes (Deleted)
Ocps or lng iud

## 2021-03-16 ENCOUNTER — Other Ambulatory Visit: Payer: Self-pay

## 2021-03-16 ENCOUNTER — Other Ambulatory Visit: Payer: Self-pay | Admitting: *Deleted

## 2021-03-16 ENCOUNTER — Encounter: Payer: Self-pay | Admitting: *Deleted

## 2021-03-16 ENCOUNTER — Ambulatory Visit: Payer: Medicaid Other | Attending: Obstetrics

## 2021-03-16 ENCOUNTER — Other Ambulatory Visit: Payer: Self-pay | Admitting: Obstetrics

## 2021-03-16 ENCOUNTER — Ambulatory Visit: Payer: Medicaid Other | Admitting: *Deleted

## 2021-03-16 VITALS — BP 115/59 | HR 96

## 2021-03-16 DIAGNOSIS — O09213 Supervision of pregnancy with history of pre-term labor, third trimester: Secondary | ICD-10-CM | POA: Diagnosis not present

## 2021-03-16 DIAGNOSIS — O34219 Maternal care for unspecified type scar from previous cesarean delivery: Secondary | ICD-10-CM

## 2021-03-16 DIAGNOSIS — O0990 Supervision of high risk pregnancy, unspecified, unspecified trimester: Secondary | ICD-10-CM | POA: Insufficient documentation

## 2021-03-16 DIAGNOSIS — Z3A31 31 weeks gestation of pregnancy: Secondary | ICD-10-CM | POA: Diagnosis not present

## 2021-03-16 DIAGNOSIS — O365930 Maternal care for other known or suspected poor fetal growth, third trimester, not applicable or unspecified: Secondary | ICD-10-CM

## 2021-03-16 NOTE — Procedures (Signed)
Danielle Singleton 01/26/1996 [redacted]w[redacted]d  Fetus A Non-Stress Test Interpretation for 03/16/21  Indication: IUGR  Fetal Heart Rate A Mode: External Baseline Rate (A): 140 bpm Variability: Moderate Accelerations: 10 x 10 Decelerations: None Multiple birth?: No  Uterine Activity Mode: Palpation, Toco Contraction Frequency (min): none Resting Tone Palpated: Relaxed  Interpretation (Fetal Testing) Nonstress Test Interpretation: Reactive Overall Impression: Reassuring for gestational age Comments: Dr. Donalee Citrin reviewed tracing

## 2021-03-23 ENCOUNTER — Encounter: Payer: Self-pay | Admitting: *Deleted

## 2021-03-23 ENCOUNTER — Ambulatory Visit: Payer: Medicaid Other | Admitting: *Deleted

## 2021-03-23 ENCOUNTER — Other Ambulatory Visit: Payer: Self-pay

## 2021-03-23 ENCOUNTER — Ambulatory Visit (INDEPENDENT_AMBULATORY_CARE_PROVIDER_SITE_OTHER): Payer: Medicaid Other | Admitting: Obstetrics & Gynecology

## 2021-03-23 ENCOUNTER — Ambulatory Visit: Payer: Medicaid Other | Attending: Obstetrics and Gynecology

## 2021-03-23 ENCOUNTER — Encounter: Payer: Self-pay | Admitting: Obstetrics & Gynecology

## 2021-03-23 VITALS — BP 123/63 | HR 94

## 2021-03-23 VITALS — BP 127/76 | HR 105 | Wt 204.0 lb

## 2021-03-23 DIAGNOSIS — O09213 Supervision of pregnancy with history of pre-term labor, third trimester: Secondary | ICD-10-CM

## 2021-03-23 DIAGNOSIS — O0990 Supervision of high risk pregnancy, unspecified, unspecified trimester: Secondary | ICD-10-CM | POA: Diagnosis present

## 2021-03-23 DIAGNOSIS — O34219 Maternal care for unspecified type scar from previous cesarean delivery: Secondary | ICD-10-CM

## 2021-03-23 DIAGNOSIS — O365930 Maternal care for other known or suspected poor fetal growth, third trimester, not applicable or unspecified: Secondary | ICD-10-CM | POA: Diagnosis present

## 2021-03-23 DIAGNOSIS — Z3A32 32 weeks gestation of pregnancy: Secondary | ICD-10-CM

## 2021-03-23 DIAGNOSIS — O09299 Supervision of pregnancy with other poor reproductive or obstetric history, unspecified trimester: Secondary | ICD-10-CM

## 2021-03-23 NOTE — Progress Notes (Signed)
PRENATAL VISIT NOTE  Subjective:  Danielle Singleton is a 26 y.o. G3P1102 at [redacted]w[redacted]d being seen today for ongoing prenatal care.  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  Patient reports no complaints.  Contractions: Not present. Vag. Bleeding: None.  Movement: Present. Denies leaking of fluid.   The following portions of the patient's history were reviewed and updated as appropriate: allergies, current medications, past family history, past medical history, past social history, past surgical history and problem list.   Objective:   Vitals:   03/23/21 1310  BP: 127/76  Pulse: (!) 105  Weight: 204 lb (92.5 kg)    Fetal Status: Fetal Heart Rate (bpm): 132 lbs   Movement: Present     General:  Alert, oriented and cooperative. Patient is in no acute distress.  Skin: Skin is warm and dry. No rash noted.   Cardiovascular: Normal heart rate noted  Respiratory: Normal respiratory effort, no problems with respiration noted  Abdomen: Soft, gravid, appropriate for gestational age.  Pain/Pressure: Absent     Pelvic: Cervical exam deferred        Extremities: Normal range of motion.     Mental Status: Normal mood and affect. Normal behavior. Normal judgment and thought content.   Imaging: [place] MFM FETAL BPP W/NONSTRESS  Result Date: 03/16/2021 ----------------------------------------------------------------------  OBSTETRICS REPORT                       (Signed Final 03/16/2021 04:31 pm) ---------------------------------------------------------------------- Patient Info  ID #:       [id number]                          D.O.B.:  1995/12/04 (25 yrs)  Name:       Danielle Singleton                     Visit Date: 03/16/2021 01:40 pm ---------------------------------------------------------------------- Performed By  Attending:        Tama High MD        Ref. Address:     [address]                                                             [place], [place]  Performed By:  Eveline Keto         [facility]                                                             [facility]                                                             [facility]  Referred By:      Chancy Milroy                    MD ---------------------------------------------------------------------- Orders  #  Description                           Code        Ordered By  1  [place] MFM OB FOLLOW UP                   64332.95    YU FANG  2  [place] MFM UA CORD DOPPLER                76820.02    YU FANG  3  [place] MFM FETAL BPP                      18841.6     YU FANG     W/NONSTRESS ----------------------------------------------------------------------  #  Order #                     Accession #                Episode #  1  [id number]                   [id number]                 [id number]  2  [id number]                   [id number]                 [id number]  3  [id number]                   [id number]                 [id number] ---------------------------------------------------------------------- Indications  [redacted] weeks gestation of pregnancy                Z3A.31  Maternal care for known or suspected poor      O36.5930  fetal growth, third trimester, not applicable or  unspecified IUGR  Poor obstetric history: Previous preterm  O09.219  delivery, antepartum (due to SPEC/HELLP)  History of cesarean delivery, currently        O37.219  pregnant  Poor obstetric history: Previous               O09.299  preeclampsia /  eclampsia/gestational HTN  LR NIPS/ Negative AFP ---------------------------------------------------------------------- Fetal Evaluation  Num Of Fetuses:         1  Fetal Heart Rate(bpm):  130  Cardiac Activity:       Observed  Presentation:           Breech  Placenta:               Anterior Left Fundal  P. Cord Insertion:      Marginal insertion prev seen  Amniotic Fluid  AFI FV:      Within normal limits  AFI Sum(cm)     %Tile       Largest Pocket(cm)  11.49           27          4.31  RUQ(cm)       RLQ(cm)       LUQ(cm)        LLQ(cm)  1.75          2.77          4.31           2.65 ---------------------------------------------------------------------- Biophysical Evaluation  Amniotic F.V:   Pocket => 2 cm             F. Tone:        Observed  F. Movement:    Observed                   N.S.T:          Reactive  F. Breathing:   Observed                   Score:          10/10 ---------------------------------------------------------------------- Biometry  BPD:        72  mm     G. Age:  28w 6d        1.2  %    CI:        67.18   %    70 - 86                                                          FL/HC:      19.6   %    19.3 - 21.3  HC:      281.3  mm     G. Age:  30w 6d          8  %    HC/AC:      1.06        0.96 - 1.17  AC:      266.4  mm     G. Age:  30w 5d         31  %    FL/BPD:     76.5   %    71 - 87  FL:       55.1  mm     G. Age:  29w 0d        2.1  %  FL/AC:      20.7   %    20 - 24  LV:        4.7  mm  Est. FW:    1508  gm      3 lb 5 oz     10  % ---------------------------------------------------------------------- OB History  Gravidity:    3         Term:   1        Prem:   1 ---------------------------------------------------------------------- Gestational Age  LMP:           31w 2d        Date:  08/09/20                 EDD:   05/16/21  U/S Today:     29w 6d                                        EDD:   05/26/21  Best:          31w 2d     Det. By:  LMP  (08/09/20)          EDD:   05/16/21  ---------------------------------------------------------------------- Anatomy  Cranium:               Appears normal         LVOT:                   Previously seen  Cavum:                 Appears normal         Aortic Arch:            Previously seen  Ventricles:            Appears normal         Ductal Arch:            Previously seen  Choroid Plexus:        Previously seen        Diaphragm:              Appears normal  Cerebellum:            Previously seen        Stomach:                Appears normal, left                                                                        sided  Posterior Fossa:       Previously seen        Abdomen:                Appears normal  Nuchal Fold:           Previously seen        Abdominal Wall:         Previously seen  Face:                  Orbits and profile     Cord Vessels:  Previously seen                         previously seen  Lips:                  Appears normal         Kidneys:                Appear normal  Palate:                Previously seen        Bladder:                Appears normal  Thoracic:              Appears normal         Spine:                  Previously seen  Heart:                 Appears normal         Upper Extremities:      Previously seen                         (4CH, axis, and                         situs)  RVOT:                  Previously seen        Lower Extremities:      Previously seen  Other:  Fetus previously seen to be female. Heels/feet, open hands/5th digits,          nasal bone, lenses, VC, 3VV and 3VTV previously visualized. ---------------------------------------------------------------------- Doppler - Fetal Vessels  Umbilical Artery   S/D     %tile      RI    %tile      PI    %tile   2.72       50    0.63       53    0.96       58 ---------------------------------------------------------------------- Cervix Uterus Adnexa  Cervix  Not visualized (advanced GA >24wks)  Uterus  Normal shape and size.  Right Ovary  Not  visualized.  Left Ovary  Not visualized. ---------------------------------------------------------------------- Impression  Fetal growth restriction.  On previous fetal growth  assessment 3 weeks ago, the estimated fetal weight was at  the 8th percentile.  Amniotic fluid is normal and good fetal activity is seen .Fetal  growth is appropriate for gestational age .  The estimated  fetal weight is at the 10th percentile.  Abdominal  circumference measurement is at the 31st percentile.  Umbilical artery Dopplers show normal forward diastolic flow.  NST is reactive.  BPP 10/10. ---------------------------------------------------------------------- Recommendations  - Continue weekly BPP UA Doppler till next fetal growth  assessment. ----------------------------------------------------------------------                  Tama High, MD Electronically Signed Final Report   03/16/2021 04:31 pm ----------------------------------------------------------------------  [place] MFM OB FOLLOW UP  Result Date: 03/16/2021 ----------------------------------------------------------------------  OBSTETRICS REPORT                       (Signed Final 03/16/2021 04:31 pm) ---------------------------------------------------------------------- Patient Info  ID #:  [id number]                          D.O.B.:  [birth year] (25 yrs)  Name:       MIKENZIE MCCANNON                    Visit Date: 03/16/2021 01:40 pm ---------------------------------------------------------------------- Performed By  Attending:        Tama High MD        Ref. Address:     [place], [place]  Performed By:     Eveline Keto         [facility]                                     [facility] at                                                              [place] [facility] for                                                             Women  Referred By:      Chancy Milroy                    MD ---------------------------------------------------------------------- Orders  #  Description                           Code        Ordered By  1  [place] MFM OB FOLLOW UP                   73220.25    YU FANG  2  [place] MFM UA CORD DOPPLER                76820.02    YU FANG  3  [place] MFM FETAL BPP                      02774.1     Peterson Ao     W/NONSTRESS ----------------------------------------------------------------------  #  Order #                     Accession #                Episode #  1  [id number]                   [id number]                 [id number]  2  [id number]                   [id number]                 [id number]  3  [id number]                   [id number]                 [id number] ---------------------------------------------------------------------- Indications  [redacted] weeks gestation of pregnancy                Z3A.57  Maternal care for known or suspected poor      O36.5930  fetal growth, third trimester, not applicable or  unspecified IUGR  Poor obstetric history: Previous preterm       O09.219  delivery, antepartum (due to SPEC/HELLP)  History of cesarean delivery, currently        O34.219  pregnant  Poor obstetric history: Previous               O09.299  preeclampsia / eclampsia/gestational HTN  LR NIPS/ Negative AFP ---------------------------------------------------------------------- Fetal Evaluation  Num Of Fetuses:         1  Fetal Heart Rate(bpm):  130  Cardiac Activity:       Observed  Presentation:           Breech  Placenta:               Anterior Left Fundal  P. Cord Insertion:      Marginal insertion prev seen  Amniotic Fluid  AFI FV:      Within normal limits  AFI Sum(cm)     %Tile       Largest Pocket(cm)  11.49           27          4.31  RUQ(cm)       RLQ(cm)       LUQ(cm)        LLQ(cm)  1.75          2.77          4.31            2.65 ---------------------------------------------------------------------- Biophysical Evaluation  Amniotic F.V:   Pocket => 2 cm             F. Tone:        Observed  F. Movement:    Observed                   N.S.T:  Reactive  F. Breathing:   Observed                   Score:          10/10 ---------------------------------------------------------------------- Biometry  BPD:        72  mm     G. Age:  28w 6d        1.2  %    CI:        67.18   %    70 - 86                                                          FL/HC:      19.6   %    19.3 - 21.3  HC:      281.3  mm     G. Age:  30w 6d          8  %    HC/AC:      1.06        0.96 - 1.17  AC:      266.4  mm     G. Age:  30w 5d         31  %    FL/BPD:     76.5   %    71 - 87  FL:       55.1  mm     G. Age:  29w 0d        2.1  %    FL/AC:      20.7   %    20 - 24  LV:        4.7  mm  Est. FW:    1508  gm      3 lb 5 oz     10  % ---------------------------------------------------------------------- OB History  Gravidity:    3         Term:   1        Prem:   1 ---------------------------------------------------------------------- Gestational Age  LMP:           31w 2d        Date:  08/09/20                 EDD:   05/16/21  U/S Today:     29w 6d                                        EDD:   05/26/21  Best:          31w 2d     Det. By:  LMP  (08/09/20)          EDD:   05/16/21 ---------------------------------------------------------------------- Anatomy  Cranium:               Appears normal         LVOT:                   Previously seen  Cavum:                 Appears normal         Aortic Arch:  Previously seen  Ventricles:            Appears normal         Ductal Arch:            Previously seen  Choroid Plexus:        Previously seen        Diaphragm:              Appears normal  Cerebellum:            Previously seen        Stomach:                Appears normal, left                                                                         sided  Posterior Fossa:       Previously seen        Abdomen:                Appears normal  Nuchal Fold:           Previously seen        Abdominal Wall:         Previously seen  Face:                  Orbits and profile     Cord Vessels:           Previously seen                         previously seen  Lips:                  Appears normal         Kidneys:                Appear normal  Palate:                Previously seen        Bladder:                Appears normal  Thoracic:              Appears normal         Spine:                  Previously seen  Heart:                 Appears normal         Upper Extremities:      Previously seen                         (4CH, axis, and                         situs)  RVOT:                  Previously seen        Lower Extremities:      Previously seen  Other:  Fetus previously seen to be female. Heels/feet, open hands/5th digits,  nasal bone, lenses, VC, 3VV and 3VTV previously visualized. ---------------------------------------------------------------------- Doppler - Fetal Vessels  Umbilical Artery   S/D     %tile      RI    %tile      PI    %tile   2.72       50    0.63       53    0.96       58 ---------------------------------------------------------------------- Cervix Uterus Adnexa  Cervix  Not visualized (advanced GA >24wks)  Uterus  Normal shape and size.  Right Ovary  Not visualized.  Left Ovary  Not visualized. ---------------------------------------------------------------------- Impression  Fetal growth restriction.  On previous fetal growth  assessment 3 weeks ago, the estimated fetal weight was at  the 8th percentile.  Amniotic fluid is normal and good fetal activity is seen .Fetal  growth is appropriate for gestational age .  The estimated  fetal weight is at the 10th percentile.  Abdominal  circumference measurement is at the 31st percentile.  Umbilical artery Dopplers show normal forward diastolic flow.  NST is reactive.  BPP 10/10.  ---------------------------------------------------------------------- Recommendations  - Continue weekly BPP UA Doppler till next fetal growth  assessment. ----------------------------------------------------------------------                  Tama High, MD Electronically Signed Final Report   03/16/2021 04:31 pm ----------------------------------------------------------------------  [place] MFM OB FOLLOW UP  Result Date: 02/23/2021 ----------------------------------------------------------------------  OBSTETRICS REPORT                       (Signed Final 02/23/2021 11:44 am) ---------------------------------------------------------------------- Patient Info  ID #:       [id number]                          D.O.B.:  May 13, 1995 (25 yrs)  Name:       Danielle Singleton                    Visit Date: 02/23/2021 09:44 am ---------------------------------------------------------------------- Performed By  Attending:        Johnell Comings MD         Ref. Address:     [address]                                                             [place], [place]  Performed By:     Eveline Keto         [facility]                                     [facility]                                                             [facility]                                                             Women  Referred By:      Chancy Milroy                    MD ---------------------------------------------------------------------- Orders  #  Description                           Code        Ordered By  1  [place] MFM OB FOLLOW UP                   [phone]    YU FANG ----------------------------------------------------------------------  #  Order #                     Accession #                Episode #  1  [id number]                   [id number]                  [id number] ---------------------------------------------------------------------- Indications  Maternal care for known or suspected poor      O36.5930  fetal growth, third trimester, not applicable or  unspecified IUGR  Poor obstetric history: Previous preterm       O09.219  delivery, antepartum (due to SPEC/HELLP)  [redacted] weeks gestation of pregnancy                Z3A.28  History of cesarean delivery, currently        O34.219  pregnant  Poor obstetric history: Previous               O09.299  preeclampsia / eclampsia/gestational HTN  LR NIPS/ Negative AFP ---------------------------------------------------------------------- Fetal Evaluation  Num Of Fetuses:         1  Fetal Heart Rate(bpm):  145  Cardiac Activity:       Observed  Presentation:           Breech  Placenta:               Posterior  P. Cord Insertion:      Marginal insertion  Amniotic Fluid  AFI FV:      Within normal limits  AFI Sum(cm)     %Tile       Largest Pocket(cm)  12  28          3.42  RUQ(cm)       RLQ(cm)       LUQ(cm)        LLQ(cm)  3.4           3.42          3.36           1.82 ---------------------------------------------------------------------- Biometry  BPD:        67  mm     G. Age:  27w 0d          7  %    CI:        72.39   %    70 - 86                                                          FL/HC:      19.3   %    18.8 - 20.6  HC:      250.5  mm     G. Age:  27w 1d        3.3  %    HC/AC:      1.07        1.05 - 1.21  AC:      234.8  mm     G. Age:  27w 6d         28  %    FL/BPD:     72.2   %    71 - 87  FL:       48.4  mm     G. Age:  26w 2d        2.1  %    FL/AC:      20.6   %    20 - 24  CER:      30.5  mm     G. Age:  26w 4d         11  %  LV:        4.8  mm  Est. FW:    1035  gm      2 lb 5 oz      8  % ---------------------------------------------------------------------- OB History  Gravidity:    3         Term:   1        Prem:   1 ----------------------------------------------------------------------  Gestational Age  LMP:           28w 2d        Date:  08/09/20                 EDD:   05/16/21  U/S Today:     27w 1d                                        EDD:   05/24/21  Best:          28w 2d     Det. By:  LMP  (08/09/20)          EDD:   05/16/21 ---------------------------------------------------------------------- Anatomy  Cranium:               Appears normal  LVOT:                   Previously seen  Cavum:                 Appears normal         Aortic Arch:            Previously seen  Ventricles:            Appears normal         Ductal Arch:            Previously seen  Choroid Plexus:        Appears normal         Diaphragm:              Appears normal  Cerebellum:            Appears normal         Stomach:                Appears normal, left                                                                        sided  Posterior Fossa:       Appears normal         Abdomen:                Appears normal  Nuchal Fold:           Previously seen        Abdominal Wall:         Previously seen  Face:                  Orbits and profile     Cord Vessels:           Previously seen                         previously seen  Lips:                  Appears normal         Kidneys:                Appear normal  Palate:                Previously seen        Bladder:                Previously seen  Thoracic:              Appears normal         Spine:                  Previously seen  Heart:                 Previously seen        Upper Extremities:      Previously seen  RVOT:                  Previously seen        Lower Extremities:      Previously seen  Other:  Fetus previously seen to be female. Heels/feet, open hands/5th digits,          nasal bone, lenses, VC, 3VV and 3VTV previously visualized. ---------------------------------------------------------------------- Doppler - Fetal Vessels  Umbilical Artery   S/D     %tile      RI    %tile      PI    %tile            ADFV    RDFV   3.75       83    0.73       82     1.29       92               No      No ---------------------------------------------------------------------- Cervix Uterus Adnexa  Cervix  Not visualized (advanced GA >24wks)  Uterus  No abnormality visualized.  Right Ovary  Within normal limits. ---------------------------------------------------------------------- Comments  This patient was seen for a follow up growth scan due to a  marginal placental cord insertion that was noted on her last  exam..  She denies any problems since her last exam and  reports feeling vigorous fetal movements throughout the day.  On today's exam, the EFW measures at the 8th percentile for  her gestational age indicating fetal growth restriction.   There  was normal amniotic fluid noted.  Fetal movements were noted throughout today's exam.  Doppler studies of the umbilical arteries showed a normal  S/D ratio of 3.75.  There were no signs of absent or reversed  end-diastolic flow.  Due to fetal growth restriction, we will continue to follow her  with weekly fetal testing and umbilical artery Doppler studies.  An NST and umbilical artery Doppler study was scheduled in  1 week. ----------------------------------------------------------------------                   Johnell Comings, MD Electronically Signed Final Report   02/23/2021 11:44 am ----------------------------------------------------------------------  [place] MFM OB LIMITED  Result Date: 03/09/2021 ----------------------------------------------------------------------  OBSTETRICS REPORT                       (Signed Final 03/09/2021 04:58 pm) ---------------------------------------------------------------------- Patient Info  ID #:       [id number]                          D.O.B.:  Mar 23, 1995 (25 yrs)  Name:       Danielle Singleton                    Visit Date: 03/09/2021 01:27 pm ---------------------------------------------------------------------- Performed By  Attending:        Johnell Comings MD         Ref. Address:     [address]  [place], [place]  Performed By:     Germain Osgood            [facility]                    [facility]                                     [facility] at                                                             [place] for                                                             Women  Referred By:      Chancy Milroy                    MD ---------------------------------------------------------------------- Orders  #  Description                           Code        Ordered By  1  [place] MFM UA CORD DOPPLER                76820.02    YU FANG  2  [place] MFM OB LIMITED                     76815.01    YU FANG ----------------------------------------------------------------------  #  Order #                     Accession #                Episode #  1  [id number]                   [id number]                 [id number]  2  [id number]                   [id number]                 [id number] ---------------------------------------------------------------------- Indications  Maternal care for known or suspected poor      O36.5930  fetal growth, third trimester, not applicable or  unspecified IUGR  Poor obstetric history: Previous preterm       O09.219  delivery, antepartum (due to SPEC/HELLP)  History of cesarean delivery, currently        O73.219  pregnant  Poor obstetric history: Previous               O09.299  preeclampsia / eclampsia/gestational HTN  LR NIPS/ Negative AFP  [redacted] weeks gestation of pregnancy                Z3A.30 ---------------------------------------------------------------------- Fetal Evaluation  Num Of Fetuses:         1  Fetal Heart Rate(bpm):  135  Cardiac Activity:       Observed  Presentation:           Breech  Placenta:               Posterior Fundal  P. Cord Insertion:      Visualized, central   Amniotic Fluid  AFI FV:      Within normal limits  AFI Sum(cm)     %Tile       Largest Pocket(cm)  10.65           19          4.16  RUQ(cm)       RLQ(cm)       LUQ(cm)        LLQ(cm)  4.16          3.73          0.41           2.35 ---------------------------------------------------------------------- Biometry  LV:        2.8  mm ---------------------------------------------------------------------- OB History  Gravidity:    3         Term:   1        Prem:   1 ---------------------------------------------------------------------- Gestational Age  LMP:           30w 2d        Date:  08/09/20                 EDD:   05/16/21  Best:          Weston Settle 2d     Det. By:  LMP  (08/09/20)          EDD:   05/16/21 ---------------------------------------------------------------------- Anatomy  Cranium:               Appears normal         LVOT:                   Previously seen  Cavum:                 Previously seen        Aortic Arch:            Previously seen  Ventricles:            Appears normal         Ductal Arch:            Previously seen  Choroid Plexus:        Previously seen        Diaphragm:              Appears normal  Cerebellum:            Previously seen        Stomach:                Appears normal, left  sided  Posterior Fossa:       Previously seen        Abdomen:                Appears normal  Nuchal Fold:           Previously seen        Abdominal Wall:         Previously seen  Face:                  Orbits and profile     Cord Vessels:           Previously seen                         previously seen  Lips:                  Previously seen        Kidneys:                Appear normal  Palate:                Previously seen        Bladder:                Appears normal  Thoracic:              Appears normal         Spine:                  Previously seen  Heart:                 Previously seen        Upper Extremities:      Previously seen   RVOT:                  Previously seen        Lower Extremities:      Previously seen  Other:  Fetus previously seen to be female. Heels/feet, open hands/5th digits,          nasal bone, lenses, VC, 3VV and 3VTV previously visualized. ---------------------------------------------------------------------- Doppler - Fetal Vessels  Umbilical Artery   S/D     %tile      RI    %tile      PI    %tile            ADFV    RDFV   2.83       51    0.65       56    0.99       59               No      No ---------------------------------------------------------------------- Cervix Uterus Adnexa  Cervix  Not visualized (advanced GA >24wks)  Uterus  No abnormality visualized.  Right Ovary  Not visualized.  Left Ovary  Not visualized.  Cul De Sac  No free fluid seen.  Adnexa  No adnexal mass visualized. ---------------------------------------------------------------------- Comments  This patient was seen due to an IUGR fetus.  She denies any  problems since her last exam.  The patient had a reactive NST today.  There was normal amniotic fluid noted on today's ultrasound  exam.  Doppler studies of the umbilical arteries performed due to  fetal growth restriction showed a normal S/D ratio of 2.83.  There were no signs of absent or reversed  end-diastolic flow  noted today.  She will return in 1 week for another umbilical artery Doppler  study, growth scan, and NST. ----------------------------------------------------------------------                   Johnell Comings, MD Electronically Signed Final Report   03/09/2021 04:58 pm ----------------------------------------------------------------------  [place] MFM OB LIMITED  Result Date: 03/02/2021 ----------------------------------------------------------------------  OBSTETRICS REPORT                       (Signed Final 03/02/2021 03:38 pm) ---------------------------------------------------------------------- Patient Info  ID #:       [id number]                          D.O.B.:  07-Jun-1995  (25 yrs)  Name:       Danielle Singleton                    Visit Date: 03/02/2021 02:22 pm ---------------------------------------------------------------------- Performed By  Attending:        Johnell Comings MD         Ref. Address:     [place], [place]  Performed By:     Benson Norway          [facility]                                     [facility]                                                             [facility]  Referred By:      Chancy Milroy                    MD ---------------------------------------------------------------------- Orders  #  Description                           Code        Ordered By  1  [place] MFM UA CORD DOPPLER                76820.02    YU FANG  2  [place] MFM OB LIMITED                     [id number]    YU FANG ----------------------------------------------------------------------  #  Order #                     Accession #                Episode #  1  [id number]                   [id number]                 [id number]  2  [id number]                   [id number]                 [id number] ---------------------------------------------------------------------- Indications  Maternal care for known or suspected poor      O36.5930  fetal growth, third trimester, not applicable or  unspecified IUGR  Poor obstetric history: Previous preterm       O09.219  delivery, antepartum (due to SPEC/HELLP)  History of cesarean delivery, currently        O34.219  pregnant  Poor obstetric history: Previous               O09.299  preeclampsia / eclampsia/gestational HTN  LR NIPS/ Negative AFP  [redacted] weeks gestation of pregnancy                Z3A.29  ---------------------------------------------------------------------- Fetal Evaluation  Num Of Fetuses:         1  Fetal Heart Rate(bpm):  127  Cardiac Activity:       Observed  Presentation:           Breech  Placenta:               Posterior Fundal  P. Cord Insertion:      Marginal insertion  Amniotic Fluid  AFI FV:      Within normal limits  AFI Sum(cm)     %Tile       Largest Pocket(cm)  14.18           47          4.05  RUQ(cm)       RLQ(cm)       LUQ(cm)        LLQ(cm)  2.78          4.05          3.79           3.56 ---------------------------------------------------------------------- Biometry  LV:        5.6  mm ---------------------------------------------------------------------- OB History  Gravidity:    3         Term:   1  Prem:   1 ---------------------------------------------------------------------- Gestational Age  LMP:           29w 2d        Date:  08/09/20                 EDD:   05/16/21  Best:          29w 2d     Det. By:  LMP  (08/09/20)          EDD:   05/16/21 ---------------------------------------------------------------------- Anatomy  Heart:                 Appears normal         Kidneys:                Appear normal                         (4CH, axis, and                         situs)  Diaphragm:             Appears normal         Bladder:                Appears normal  Stomach:               Appears normal, left                         sided ---------------------------------------------------------------------- Doppler - Fetal Vessels  Umbilical Artery   S/D     %tile      RI    %tile      PI    %tile   3.39       76     0.7       76    1.15       81 ---------------------------------------------------------------------- Cervix Uterus Adnexa  Adnexa  No abnormality visualized. ---------------------------------------------------------------------- Comments  This patient was seen due to an IUGR fetus.  She denies any  problems since her last exam.  The patient had a reactive NST  today.  There was normal amniotic fluid noted on today's ultrasound  exam.  Doppler studies of the umbilical arteries performed due to  fetal growth restriction showed a normal S/D ratio of 3.39.  There were no signs of absent or reversed end-diastolic flow  noted today.  She will return in 1 week for another umbilical artery Doppler  study and NST. ----------------------------------------------------------------------                   Johnell Comings, MD Electronically Signed Final Report   03/02/2021 03:38 pm ----------------------------------------------------------------------  [place] MFM UA CORD DOPPLER  Result Date: 03/16/2021 ----------------------------------------------------------------------  OBSTETRICS REPORT                       (Signed Final 03/16/2021 04:31 pm) ---------------------------------------------------------------------- Patient Info  ID #:       [id number]                          D.O.B.:  26-Aug-1995 (25 yrs)  Name:       Danielle Singleton                    Visit Date: 03/16/2021 01:40 pm ---------------------------------------------------------------------- Performed By  Attending:        Tama High MD        Ref. Address:     [place], [place]  Performed By:     Eveline Keto         [facility]                                     [facility]                                                             [facility]                                                             [facility]  Referred By:      Chancy Milroy                    MD ---------------------------------------------------------------------- Orders  #  Description                           Code        Ordered By  1  [place] MFM OB FOLLOW UP                   62703.50    YU FANG  2  [place]  MFM UA CORD DOPPLER                76820.02    YU FANG  3  [place] MFM FETAL BPP                      09381.8     YU FANG     W/NONSTRESS ----------------------------------------------------------------------  #  Order #                     Accession #                Episode #  1  [id number]                   [id number]                 [id number]  2  [id number]                   [id number]                 [id number]  3  [id number]                   [id number]                 [id number] ---------------------------------------------------------------------- Indications  [redacted] weeks gestation of pregnancy                Z3A.64  Maternal care for known or suspected poor      O36.5930  fetal growth, third trimester, not applicable or  unspecified IUGR  Poor obstetric history: Previous preterm       O09.219  delivery, antepartum (due to SPEC/HELLP)  History of cesarean delivery, currently        O34.219  pregnant  Poor obstetric history: Previous               O09.299  preeclampsia / eclampsia/gestational HTN  LR NIPS/ Negative AFP ---------------------------------------------------------------------- Fetal Evaluation  Num Of Fetuses:         1  Fetal Heart Rate(bpm):  130  Cardiac Activity:       Observed  Presentation:           Breech  Placenta:               Anterior Left Fundal  P. Cord Insertion:      Marginal insertion prev seen  Amniotic Fluid  AFI FV:      Within normal limits  AFI Sum(cm)     %Tile       Largest Pocket(cm)  11.49           27          4.31  RUQ(cm)       RLQ(cm)       LUQ(cm)        LLQ(cm)  1.75          2.77          4.31           2.65 ---------------------------------------------------------------------- Biophysical Evaluation  Amniotic F.V:   Pocket => 2 cm             F. Tone:        Observed  F. Movement:    Observed                   N.S.T:          Reactive  F. Breathing:   Observed                   Score:          10/10 ---------------------------------------------------------------------- Biometry   BPD:        72  mm     G. Age:  28w 6d        1.2  %  CI:        67.18   %    70 - 86                                                          FL/HC:      19.6   %    19.3 - 21.3  HC:      281.3  mm     G. Age:  30w 6d          8  %    HC/AC:      1.06        0.96 - 1.17  AC:      266.4  mm     G. Age:  30w 5d         31  %    FL/BPD:     76.5   %    71 - 87  FL:       55.1  mm     G. Age:  29w 0d        2.1  %    FL/AC:      20.7   %    20 - 24  LV:        4.7  mm  Est. FW:    1508  gm      3 lb 5 oz     10  % ---------------------------------------------------------------------- OB History  Gravidity:    3         Term:   1        Prem:   1 ---------------------------------------------------------------------- Gestational Age  LMP:           31w 2d        Date:  08/09/20                 EDD:   05/16/21  U/S Today:     29w 6d                                        EDD:   05/26/21  Best:          31w 2d     Det. By:  LMP  (08/09/20)          EDD:   05/16/21 ---------------------------------------------------------------------- Anatomy  Cranium:               Appears normal         LVOT:                   Previously seen  Cavum:                 Appears normal         Aortic Arch:            Previously seen  Ventricles:            Appears normal         Ductal Arch:            Previously seen  Choroid Plexus:        Previously seen        Diaphragm:  Appears normal  Cerebellum:            Previously seen        Stomach:                Appears normal, left                                                                        sided  Posterior Fossa:       Previously seen        Abdomen:                Appears normal  Nuchal Fold:           Previously seen        Abdominal Wall:         Previously seen  Face:                  Orbits and profile     Cord Vessels:           Previously seen                         previously seen  Lips:                  Appears normal         Kidneys:                Appear  normal  Palate:                Previously seen        Bladder:                Appears normal  Thoracic:              Appears normal         Spine:                  Previously seen  Heart:                 Appears normal         Upper Extremities:      Previously seen                         (4CH, axis, and                         situs)  RVOT:                  Previously seen        Lower Extremities:      Previously seen  Other:  Fetus previously seen to be female. Heels/feet, open hands/5th digits,          nasal bone, lenses, VC, 3VV and 3VTV previously visualized. ---------------------------------------------------------------------- Doppler - Fetal Vessels  Umbilical Artery   S/D     %tile      RI    %tile      PI    %tile   2.72       50    0.63  53    0.96       58 ---------------------------------------------------------------------- Cervix Uterus Adnexa  Cervix  Not visualized (advanced GA >24wks)  Uterus  Normal shape and size.  Right Ovary  Not visualized.  Left Ovary  Not visualized. ---------------------------------------------------------------------- Impression  Fetal growth restriction.  On previous fetal growth  assessment 3 weeks ago, the estimated fetal weight was at  the 8th percentile.  Amniotic fluid is normal and good fetal activity is seen .Fetal  growth is appropriate for gestational age .  The estimated  fetal weight is at the 10th percentile.  Abdominal  circumference measurement is at the 31st percentile.  Umbilical artery Dopplers show normal forward diastolic flow.  NST is reactive.  BPP 10/10. ---------------------------------------------------------------------- Recommendations  - Continue weekly BPP UA Doppler till next fetal growth  assessment. ----------------------------------------------------------------------                  Tama High, MD Electronically Signed Final Report   03/16/2021 04:31 pm ----------------------------------------------------------------------  [place]  MFM UA CORD DOPPLER  Result Date: 03/09/2021 ----------------------------------------------------------------------  OBSTETRICS REPORT                       (Signed Final 03/09/2021 04:58 pm) ---------------------------------------------------------------------- Patient Info  ID #:       [id number]                          D.O.B.:  [birth year] (25 yrs)  Name:       Danielle Singleton                    Visit Date: 03/09/2021 01:27 pm ---------------------------------------------------------------------- Performed By  Attending:        Johnell Comings MD         Ref. Address:     [address]                                                             [place], [place]  Performed By:     Germain Osgood            [facility]                                                             [facility]  Referred By:      Chancy Milroy                    MD ---------------------------------------------------------------------- Orders  #  Description                           Code        Ordered By  1  [place] MFM UA CORD DOPPLER                76820.02    YU FANG  2  [place] MFM OB LIMITED                     [id number]    YU FANG ----------------------------------------------------------------------  #  Order #                     Accession #                Episode #  1  [id number]                   [id number]                 [id number]  2  [id number]                   [id number]                 [id number] ---------------------------------------------------------------------- Indications  Maternal care for known or suspected poor      O36.5930  fetal growth, third trimester, not applicable or  unspecified IUGR  Poor obstetric history: Previous preterm        O09.219  delivery, antepartum (due to SPEC/HELLP)  History of cesarean delivery, currently        O34.219  pregnant  Poor obstetric history: Previous               O09.299  preeclampsia / eclampsia/gestational HTN  LR NIPS/ Negative AFP  [redacted] weeks gestation of pregnancy                Z3A.30 ---------------------------------------------------------------------- Fetal Evaluation  Num Of Fetuses:         1  Fetal Heart Rate(bpm):  135  Cardiac Activity:       Observed  Presentation:           Breech  Placenta:               Posterior Fundal  P. Cord Insertion:      Visualized, central  Amniotic Fluid  AFI FV:      Within normal limits  AFI Sum(cm)     %Tile       Largest Pocket(cm)  10.65           19          4.16  RUQ(cm)       RLQ(cm)       LUQ(cm)        LLQ(cm)  4.16  3.73          0.41           2.35 ---------------------------------------------------------------------- Biometry  LV:        2.8  mm ---------------------------------------------------------------------- OB History  Gravidity:    3         Term:   1        Prem:   1 ---------------------------------------------------------------------- Gestational Age  LMP:           30w 2d        Date:  08/09/20                 EDD:   05/16/21  Best:          Weston Settle 2d     Det. By:  LMP  (08/09/20)          EDD:   05/16/21 ---------------------------------------------------------------------- Anatomy  Cranium:               Appears normal         LVOT:                   Previously seen  Cavum:                 Previously seen        Aortic Arch:            Previously seen  Ventricles:            Appears normal         Ductal Arch:            Previously seen  Choroid Plexus:        Previously seen        Diaphragm:              Appears normal  Cerebellum:            Previously seen        Stomach:                Appears normal, left                                                                        sided  Posterior Fossa:       Previously seen         Abdomen:                Appears normal  Nuchal Fold:           Previously seen        Abdominal Wall:         Previously seen  Face:                  Orbits and profile     Cord Vessels:           Previously seen                         previously seen  Lips:                  Previously seen        Kidneys:  Appear normal  Palate:                Previously seen        Bladder:                Appears normal  Thoracic:              Appears normal         Spine:                  Previously seen  Heart:                 Previously seen        Upper Extremities:      Previously seen  RVOT:                  Previously seen        Lower Extremities:      Previously seen  Other:  Fetus previously seen to be female. Heels/feet, open hands/5th digits,          nasal bone, lenses, VC, 3VV and 3VTV previously visualized. ---------------------------------------------------------------------- Doppler - Fetal Vessels  Umbilical Artery   S/D     %tile      RI    %tile      PI    %tile            ADFV    RDFV   2.83       51    0.65       56    0.99       59               No      No ---------------------------------------------------------------------- Cervix Uterus Adnexa  Cervix  Not visualized (advanced GA >24wks)  Uterus  No abnormality visualized.  Right Ovary  Not visualized.  Left Ovary  Not visualized.  Cul De Sac  No free fluid seen.  Adnexa  No adnexal mass visualized. ---------------------------------------------------------------------- Comments  This patient was seen due to an IUGR fetus.  She denies any  problems since her last exam.  The patient had a reactive NST today.  There was normal amniotic fluid noted on today's ultrasound  exam.  Doppler studies of the umbilical arteries performed due to  fetal growth restriction showed a normal S/D ratio of 2.83.  There were no signs of absent or reversed end-diastolic flow  noted today.  She will return in 1 week for another umbilical artery Doppler  study,  growth scan, and NST. ----------------------------------------------------------------------                   Johnell Comings, MD Electronically Signed Final Report   03/09/2021 04:58 pm ----------------------------------------------------------------------  [place] MFM UA CORD DOPPLER  Result Date: 03/02/2021 ----------------------------------------------------------------------  OBSTETRICS REPORT                       (Signed Final 03/02/2021 03:38 pm) ---------------------------------------------------------------------- Patient Info  ID #:       [id number]                          D.O.B.:  Oct 31, 1995 (25 yrs)  Name:       Danielle Singleton                    Visit Date: 03/02/2021 02:22 pm ---------------------------------------------------------------------- Performed By  Attending:  Johnell Comings MD         Ref. Address:     [place], [place]  Performed By:     Benson Norway          [facility]                                     [facility]                                                             [place]                                                             [facility]  Referred By:      Chancy Milroy                    MD ---------------------------------------------------------------------- Orders  #  Description                           Code        Ordered By  1  [place] MFM UA CORD DOPPLER                [phone]    Peterson Ao  2  [place] MFM OB LIMITED                     26948.54    YU FANG ----------------------------------------------------------------------  #  Order #                     Accession #                Episode #  1  [id number]  [id number]                 [id number]  2  [id number]                   [id number]                  [id number] ---------------------------------------------------------------------- Indications  Maternal care for known or suspected poor      O36.5930  fetal growth, third trimester, not applicable or  unspecified IUGR  Poor obstetric history: Previous preterm       O09.219  delivery, antepartum (due to SPEC/HELLP)  History of cesarean delivery, currently        O34.219  pregnant  Poor obstetric history: Previous               O09.299  preeclampsia / eclampsia/gestational HTN  LR NIPS/ Negative AFP  [redacted] weeks gestation of pregnancy                Z3A.29 ---------------------------------------------------------------------- Fetal Evaluation  Num Of Fetuses:         1  Fetal Heart Rate(bpm):  127  Cardiac Activity:       Observed  Presentation:           Breech  Placenta:               Posterior Fundal  P. Cord Insertion:      Marginal insertion  Amniotic Fluid  AFI FV:      Within normal limits  AFI Sum(cm)     %Tile       Largest Pocket(cm)  14.18           47          4.05  RUQ(cm)       RLQ(cm)       LUQ(cm)        LLQ(cm)  2.78          4.05          3.79           3.56 ---------------------------------------------------------------------- Biometry  LV:        5.6  mm ---------------------------------------------------------------------- OB History  Gravidity:    3         Term:   1        Prem:   1 ---------------------------------------------------------------------- Gestational Age  LMP:           29w 2d        Date:  08/09/20                 EDD:   05/16/21  Best:          29w 2d     Det. By:  LMP  (08/09/20)          EDD:   05/16/21 ---------------------------------------------------------------------- Anatomy  Heart:                 Appears normal         Kidneys:                Appear normal                         (4CH, axis, and                         situs)  Diaphragm:  Appears normal         Bladder:                Appears normal  Stomach:               Appears normal, left                          sided ---------------------------------------------------------------------- Doppler - Fetal Vessels  Umbilical Artery   S/D     %tile      RI    %tile      PI    %tile   3.39       76     0.7       76    1.15       81 ---------------------------------------------------------------------- Cervix Uterus Adnexa  Adnexa  No abnormality visualized. ---------------------------------------------------------------------- Comments  This patient was seen due to an IUGR fetus.  She denies any  problems since her last exam.  The patient had a reactive NST today.  There was normal amniotic fluid noted on today's ultrasound  exam.  Doppler studies of the umbilical arteries performed due to  fetal growth restriction showed a normal S/D ratio of 3.39.  There were no signs of absent or reversed end-diastolic flow  noted today.  She will return in 1 week for another umbilical artery Doppler  study and NST. ----------------------------------------------------------------------                   Johnell Comings, MD Electronically Signed Final Report   03/02/2021 03:38 pm ----------------------------------------------------------------------   Assessment and Plan:  Pregnancy: [id number] at [redacted]w[redacted]d 1. Poor fetal growth affecting management of mother in third trimester, single or unspecified fetus Continue scans, antenatal testing per MFM.  2. 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 PEC precautions reviewed.  3. Hx successful VBAC (vaginal birth after cesarean), currently pregnant Consented for TOLAC.  4. [redacted] weeks gestation of pregnancy 5. Supervision of high risk pregnancy, antepartum No other concerns. Preterm labor symptoms and general obstetric precautions including but not limited to vaginal bleeding, contractions, leaking of fluid and fetal movement were reviewed in detail with the patient. Please refer to After Visit Summary for other counseling recommendations.    Return in about 2 weeks (around 04/06/2021) for OFFICE OB VISIT (MD or APP).  Future Appointments  Date Time Provider [place]  03/23/2021  1:35 PM Osborne Oman, MD [facility] [place]  03/23/2021  2:30 PM [facility] NURSE [facility]  03/23/2021  2:45 PM [facility] US5 [facility]  03/30/2021 12:30 PM [facility] NURSE [facility]  03/30/2021 12:45 PM [facility] US5 [facility]  04/06/2021  2:15 PM [facility] NURSE [facility]  04/06/2021  2:30 PM [facility]-MFC US3 W[facility]-MFCUS [place]    Verita Schneiders, MD

## 2021-03-30 ENCOUNTER — Ambulatory Visit: Payer: Medicaid Other | Admitting: *Deleted

## 2021-03-30 ENCOUNTER — Ambulatory Visit: Payer: Medicaid Other | Attending: Obstetrics and Gynecology

## 2021-03-30 ENCOUNTER — Other Ambulatory Visit: Payer: Self-pay

## 2021-03-30 ENCOUNTER — Other Ambulatory Visit: Payer: Self-pay | Admitting: *Deleted

## 2021-03-30 VITALS — BP 116/59 | HR 84

## 2021-03-30 DIAGNOSIS — E668 Other obesity: Secondary | ICD-10-CM | POA: Diagnosis not present

## 2021-03-30 DIAGNOSIS — O0990 Supervision of high risk pregnancy, unspecified, unspecified trimester: Secondary | ICD-10-CM | POA: Insufficient documentation

## 2021-03-30 DIAGNOSIS — Z3A33 33 weeks gestation of pregnancy: Secondary | ICD-10-CM

## 2021-03-30 DIAGNOSIS — O43193 Other malformation of placenta, third trimester: Secondary | ICD-10-CM

## 2021-03-30 DIAGNOSIS — O365930 Maternal care for other known or suspected poor fetal growth, third trimester, not applicable or unspecified: Secondary | ICD-10-CM | POA: Insufficient documentation

## 2021-03-30 DIAGNOSIS — O09893 Supervision of other high risk pregnancies, third trimester: Secondary | ICD-10-CM

## 2021-03-30 DIAGNOSIS — O09293 Supervision of pregnancy with other poor reproductive or obstetric history, third trimester: Secondary | ICD-10-CM

## 2021-03-30 DIAGNOSIS — O99213 Obesity complicating pregnancy, third trimester: Secondary | ICD-10-CM

## 2021-03-30 DIAGNOSIS — O365931 Maternal care for other known or suspected poor fetal growth, third trimester, fetus 1: Secondary | ICD-10-CM

## 2021-04-06 ENCOUNTER — Encounter: Payer: Self-pay | Admitting: *Deleted

## 2021-04-06 ENCOUNTER — Other Ambulatory Visit: Payer: Self-pay | Admitting: *Deleted

## 2021-04-06 ENCOUNTER — Ambulatory Visit: Payer: Medicaid Other | Admitting: *Deleted

## 2021-04-06 ENCOUNTER — Ambulatory Visit (INDEPENDENT_AMBULATORY_CARE_PROVIDER_SITE_OTHER): Payer: Medicaid Other | Admitting: Obstetrics and Gynecology

## 2021-04-06 ENCOUNTER — Ambulatory Visit: Payer: Medicaid Other | Attending: Obstetrics and Gynecology

## 2021-04-06 ENCOUNTER — Other Ambulatory Visit: Payer: Self-pay

## 2021-04-06 VITALS — BP 113/53 | HR 93 | Wt 205.3 lb

## 2021-04-06 VITALS — BP 130/62 | HR 100

## 2021-04-06 DIAGNOSIS — O365930 Maternal care for other known or suspected poor fetal growth, third trimester, not applicable or unspecified: Secondary | ICD-10-CM

## 2021-04-06 DIAGNOSIS — O0990 Supervision of high risk pregnancy, unspecified, unspecified trimester: Secondary | ICD-10-CM | POA: Insufficient documentation

## 2021-04-06 DIAGNOSIS — O34219 Maternal care for unspecified type scar from previous cesarean delivery: Secondary | ICD-10-CM

## 2021-04-06 DIAGNOSIS — O09299 Supervision of pregnancy with other poor reproductive or obstetric history, unspecified trimester: Secondary | ICD-10-CM

## 2021-04-06 DIAGNOSIS — Z362 Encounter for other antenatal screening follow-up: Secondary | ICD-10-CM | POA: Diagnosis not present

## 2021-04-06 DIAGNOSIS — Z3A34 34 weeks gestation of pregnancy: Secondary | ICD-10-CM | POA: Diagnosis not present

## 2021-04-06 DIAGNOSIS — O365990 Maternal care for other known or suspected poor fetal growth, unspecified trimester, not applicable or unspecified: Secondary | ICD-10-CM

## 2021-04-06 NOTE — Progress Notes (Signed)
° °  PRENATAL VISIT NOTE  Subjective:  Danielle Singleton is a 26 y.o. G3P1102 at [redacted]w[redacted]d being seen today for ongoing prenatal care.  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  Patient doing well with no acute concerns today. She reports  pelvic pressure .  Contractions: Not present. Vag. Bleeding: None.  Movement: Present. Denies leaking of fluid.   The following portions of the patient's history were reviewed and updated as appropriate: allergies, current medications, past family history, past medical history, past social history, past surgical history and problem list. Problem list updated.  Objective:   Vitals:   04/06/21 1321  BP: (!) 113/53  Pulse: 93  Weight: 205 lb 4.8 oz (93.1 kg)    Fetal Status: Fetal Heart Rate (bpm): 143 Fundal Height: 35 cm Movement: Present     General:  Alert, oriented and cooperative. Patient is in no acute distress.  Skin: Skin is warm and dry. No rash noted.   Cardiovascular: Normal heart rate noted  Respiratory: Normal respiratory effort, no problems with respiration noted  Abdomen: Soft, gravid, appropriate for gestational age.  Pain/Pressure: Present     Pelvic: Cervical exam deferred        Extremities: Normal range of motion.     Mental Status:  Normal mood and affect. Normal behavior. Normal judgment and thought content.   Assessment and Plan:  Pregnancy: G3P1102 at [redacted]w[redacted]d  1. [redacted] weeks gestation of pregnancy   2. 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 Supervision of high risk pregnancy, antepartum Continue routine care 36 week swabs at next visit  4. Hx  successful VBAC (vaginal birth after cesarean), currently pregnant Pt desires TOLAC  5. Poor fetal growth affecting management of mother in third trimester, single or unspecified fetus Pt has growth/BPP/NST today EFW will help determine time of delivery, last EFW was at 10%  Preterm labor symptoms and general obstetric precautions including but not limited to vaginal bleeding, contractions, leaking of fluid and fetal movement were reviewed in detail with the patient.  Please refer to After Visit Summary for other counseling recommendations.   Return in about 2 weeks (around 04/20/2021) for [facility], in person, 36 weeks swabs.   Lynnda Shields, MD Faculty Attending Center for [facility]

## 2021-04-13 ENCOUNTER — Ambulatory Visit: Payer: Medicaid Other

## 2021-04-20 ENCOUNTER — Ambulatory Visit: Payer: Medicaid Other

## 2021-04-22 ENCOUNTER — Other Ambulatory Visit: Payer: Self-pay

## 2021-04-22 ENCOUNTER — Other Ambulatory Visit (HOSPITAL_COMMUNITY)
Admission: RE | Admit: 2021-04-22 | Discharge: 2021-04-22 | Disposition: A | Discharge status: Home / Self Care | Payer: Medicaid Other | Source: Ambulatory Visit | Attending: Obstetrics and Gynecology | Admitting: Obstetrics and Gynecology

## 2021-04-22 ENCOUNTER — Ambulatory Visit (INDEPENDENT_AMBULATORY_CARE_PROVIDER_SITE_OTHER): Payer: Medicaid Other | Admitting: Obstetrics and Gynecology

## 2021-04-22 VITALS — BP 127/73 | HR 103 | Wt 205.0 lb

## 2021-04-22 DIAGNOSIS — O0993 Supervision of high risk pregnancy, unspecified, third trimester: Secondary | ICD-10-CM | POA: Insufficient documentation

## 2021-04-22 DIAGNOSIS — Z3A36 36 weeks gestation of pregnancy: Secondary | ICD-10-CM | POA: Insufficient documentation

## 2021-04-22 DIAGNOSIS — O09299 Supervision of pregnancy with other poor reproductive or obstetric history, unspecified trimester: Secondary | ICD-10-CM

## 2021-04-22 DIAGNOSIS — O365930 Maternal care for other known or suspected poor fetal growth, third trimester, not applicable or unspecified: Secondary | ICD-10-CM

## 2021-04-22 DIAGNOSIS — O34219 Maternal care for unspecified type scar from previous cesarean delivery: Secondary | ICD-10-CM | POA: Diagnosis not present

## 2021-04-22 DIAGNOSIS — O09293 Supervision of pregnancy with other poor reproductive or obstetric history, third trimester: Secondary | ICD-10-CM | POA: Diagnosis not present

## 2021-04-22 DIAGNOSIS — O43193 Other malformation of placenta, third trimester: Secondary | ICD-10-CM

## 2021-04-22 DIAGNOSIS — O0990 Supervision of high risk pregnancy, unspecified, unspecified trimester: Secondary | ICD-10-CM

## 2021-04-22 LAB — OB RESULTS CONSOLE GC/CHLAMYDIA: Gonorrhea: NEGATIVE

## 2021-04-22 NOTE — Progress Notes (Signed)
? ?  PRENATAL VISIT NOTE ? ?Subjective:  ?Danielle Singleton is a 26 y.o. G3P1102 at 39w4dbeing seen today for ongoing prenatal care.  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 ? ?Patient reports no complaints.  Contractions: Irritability. Vag. Bleeding: None.  Movement: Present. Denies leaking of fluid.  ? ?The following portions of the patient's history were reviewed and updated as appropriate: allergies, current medications, past family history, past medical history, past social history, past surgical history and problem list.  ? ?Objective:  ? ?Vitals:  ? 04/22/21 1101  ?BP: 127/73  ?Pulse: (!) 103  ?Weight: 205 lb (93 kg)  ? ? ?Fetal Status: Fetal Heart Rate (bpm): 150 Fundal Height: 36 cm Movement: Present  Presentation: Vertex ? ?General:  Alert, oriented and cooperative. Patient is in no acute distress.  ?Skin: Skin is warm and dry. No rash noted.   ?Cardiovascular: Normal heart rate noted  ?Respiratory: Normal respiratory effort, no problems with respiration noted  ?Abdomen: Soft, gravid, appropriate for gestational age.  Pain/Pressure: Present     ?Pelvic: Cervical exam performed in the presence of a chaperone Dilation: Closed Effacement (%): Thick Station: Ballotable  ?Extremities: Normal range of motion.     ?Mental Status: Normal mood and affect. Normal behavior. Normal judgment and thought content.  ? ?Assessment and Plan:  ?Pregnancy: GQ6V7846at 389w4d1. [redacted] weeks gestation of pregnancy ?D/w pt more BC ?- Culture, beta strep (group b only) ?- Cervicovaginal ancillary only( [place]) ? ?2. Marginal insertion of umbilical cord affecting management of mother in third trimester ?See  below ? ?3. Supervision of high risk pregnancy, antepartum ?Resolved FGR but with marginal cord insertion. F/u growth u/s on Monday. Consider IOL at 39wks ? ?4. Hx successful VBAC (vaginal birth after cesarean), currently pregnant ?Tolac consent already signed  ? ?5. Poor fetal growth affecting management of mother in third trimester, single or unspecified fetus ?See above ?2/27: 14%, 2111gm, ac 37% ? ?6. 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 ?Preterm labor symptoms and general obstetric precautions including but not limited to vaginal bleeding, contractions, leaking of fluid and fetal movement were reviewed in detail with the patient. ?Please refer to After Visit Summary for other counseling recommendations.  ? ?Return in about 1 week (around 04/29/2021) for in person, md visit, high risk ob. ? ?Future Appointments  ?Date Time Provider [place]?04/27/2021  2:30 PM [facility] NURSE [facility]  ?04/27/2021  2:45 PM [facility] US5 [facility]  ? ? ?ChAletha HalimMD ? ?

## 2021-04-22 NOTE — Progress Notes (Signed)
Patient believes she is starting to have contraction. She stated that her stomach will get "hard and feel like cramps". This started roughly "4 days ago" ? ?Patient denies any vaginal bleeding and stated that baby has been moving well. ? ?I informed Julianne that she is do for her routine "36 weeks" vaginal swabs and that she will be seeing a female provider. Patient agreed to today's exam, no further questions or concerns. ? ? ?Norris Brumbach, CMA ?

## 2021-04-23 LAB — CERVICOVAGINAL ANCILLARY ONLY
Chlamydia: NEGATIVE
Comment: NEGATIVE
Comment: NORMAL
Neisseria Gonorrhea: NEGATIVE

## 2021-04-26 LAB — CULTURE, BETA STREP (GROUP B ONLY): Strep Gp B Culture: NEGATIVE

## 2021-04-27 ENCOUNTER — Ambulatory Visit: Payer: Medicaid Other | Attending: Obstetrics

## 2021-04-27 ENCOUNTER — Encounter: Payer: Self-pay | Admitting: *Deleted

## 2021-04-27 ENCOUNTER — Ambulatory Visit: Payer: Medicaid Other | Admitting: *Deleted

## 2021-04-27 ENCOUNTER — Other Ambulatory Visit: Payer: Self-pay

## 2021-04-27 VITALS — BP 133/68 | HR 80

## 2021-04-27 DIAGNOSIS — O09293 Supervision of pregnancy with other poor reproductive or obstetric history, third trimester: Secondary | ICD-10-CM | POA: Insufficient documentation

## 2021-04-27 DIAGNOSIS — O43193 Other malformation of placenta, third trimester: Secondary | ICD-10-CM | POA: Insufficient documentation

## 2021-04-27 DIAGNOSIS — Z3A37 37 weeks gestation of pregnancy: Secondary | ICD-10-CM | POA: Diagnosis not present

## 2021-04-27 DIAGNOSIS — O0990 Supervision of high risk pregnancy, unspecified, unspecified trimester: Secondary | ICD-10-CM | POA: Insufficient documentation

## 2021-04-27 DIAGNOSIS — O365931 Maternal care for other known or suspected poor fetal growth, third trimester, fetus 1: Secondary | ICD-10-CM | POA: Diagnosis present

## 2021-04-27 DIAGNOSIS — O09893 Supervision of other high risk pregnancies, third trimester: Secondary | ICD-10-CM | POA: Insufficient documentation

## 2021-04-27 DIAGNOSIS — O365930 Maternal care for other known or suspected poor fetal growth, third trimester, not applicable or unspecified: Secondary | ICD-10-CM | POA: Diagnosis not present

## 2021-04-28 ENCOUNTER — Other Ambulatory Visit: Payer: Self-pay | Admitting: *Deleted

## 2021-04-28 DIAGNOSIS — O365931 Maternal care for other known or suspected poor fetal growth, third trimester, fetus 1: Secondary | ICD-10-CM

## 2021-04-30 ENCOUNTER — Telehealth (HOSPITAL_COMMUNITY): Payer: Self-pay | Admitting: *Deleted

## 2021-04-30 ENCOUNTER — Ambulatory Visit (INDEPENDENT_AMBULATORY_CARE_PROVIDER_SITE_OTHER): Payer: Medicaid Other | Admitting: Family Medicine

## 2021-04-30 ENCOUNTER — Other Ambulatory Visit: Payer: Self-pay

## 2021-04-30 VITALS — BP 121/77 | HR 99 | Wt 207.3 lb

## 2021-04-30 DIAGNOSIS — O34219 Maternal care for unspecified type scar from previous cesarean delivery: Secondary | ICD-10-CM

## 2021-04-30 DIAGNOSIS — O0990 Supervision of high risk pregnancy, unspecified, unspecified trimester: Secondary | ICD-10-CM

## 2021-04-30 DIAGNOSIS — O321XX0 Maternal care for breech presentation, not applicable or unspecified: Secondary | ICD-10-CM | POA: Insufficient documentation

## 2021-04-30 DIAGNOSIS — O09299 Supervision of pregnancy with other poor reproductive or obstetric history, unspecified trimester: Secondary | ICD-10-CM

## 2021-04-30 DIAGNOSIS — O365930 Maternal care for other known or suspected poor fetal growth, third trimester, not applicable or unspecified: Secondary | ICD-10-CM

## 2021-04-30 NOTE — Telephone Encounter (Signed)
Preadmission screen  

## 2021-04-30 NOTE — Progress Notes (Signed)
? ?  PRENATAL VISIT NOTE ? ?Subjective:  ?Danielle Singleton is a 26 y.o. G3P1102 at 85w5dbeing seen today for ongoing prenatal care.  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 ? ?Patient reports no complaints.  Contractions: Irritability. Vag. Bleeding: None.  Movement: Present. Denies leaking of fluid.  ? ?The following portions of the patient's history were reviewed and updated as appropriate: allergies, current medications, past family history, past medical history, past social history, past surgical history and problem list.  ? ?Objective:  ? ?Vitals:  ? 04/30/21 1444  ?BP: 121/77  ?Pulse: 99  ?Weight: 207 lb 4.8 oz (94 kg)  ? ? ?Fetal Status: Fetal Heart Rate (bpm): 157 Fundal Height: 35 cm Movement: Present  Presentation: FPilar PlateBreech ? ?General:  Alert, oriented and cooperative. Patient is in no acute distress.  ?Skin: Skin is warm and dry. No rash noted.   ?Cardiovascular: Normal heart rate noted  ?Respiratory: Normal respiratory effort, no problems with respiration noted  ?Abdomen: Soft, gravid, appropriate for gestational age.  Pain/Pressure: Present     ?Pelvic: Cervical exam deferred        ?Extremities: Normal range of motion.     ?Mental Status: Normal mood and affect. Normal behavior. Normal judgment and thought content.  ? ?Assessment and Plan:  ?Pregnancy: GL8V5643at 371w5d1. 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 ?2. Supervision of high risk pregnancy, antepartum ?Continue prenatal care. ? ?3. Hx successful VBAC (vaginal birth after cesarean), currently pregnant ?Desires TOLAC again - papers signed ? ?4. Poor fetal growth  affecting management of mother in third trimester, single or unspecified fetus ?SGA EFW at 16% ?For delivery at 39 weeks ? ?5. Breech presentation, single or unspecified fetus ?Offered and accepted ECV-->Booked on 3/25 with myself ? ?Term labor symptoms and general obstetric precautions including but not limited to vaginal bleeding, contractions, leaking of fluid and fetal movement were reviewed in detail with the patient. ?Please refer to After Visit Summary for other counseling recommendations.  ? ?Return in 1 week (on 05/07/2021) [facility]? ?Future Appointments  ?Date Time Provider [place]?05/02/2021  8:00 AM MC-LD SCHED ROOM MC-INDC None  ?05/07/2021  1:55 PM ArWoodroe ModeMD [facility]?05/08/2021  8:30 AM [facility] NURSE [facility]  ?05/08/2021  8:45 AM [facility] US6 [facility]US [facility]  ?05/13/2021  1:15 PM StTruett MainlandDO [facility]? ? ?TaDonnamae JudeMD ? ?

## 2021-04-30 NOTE — Patient Instructions (Signed)

## 2021-05-01 ENCOUNTER — Encounter (HOSPITAL_COMMUNITY): Payer: Self-pay | Admitting: *Deleted

## 2021-05-01 ENCOUNTER — Telehealth (HOSPITAL_COMMUNITY): Payer: Self-pay | Admitting: *Deleted

## 2021-05-01 ENCOUNTER — Encounter (HOSPITAL_COMMUNITY): Payer: Self-pay | Admitting: Family Medicine

## 2021-05-01 ENCOUNTER — Other Ambulatory Visit: Payer: Self-pay | Admitting: Advanced Practice Midwife

## 2021-05-01 NOTE — Telephone Encounter (Signed)
Preadmission screen  

## 2021-05-02 ENCOUNTER — Observation Stay (HOSPITAL_COMMUNITY)
Admission: RE | Admit: 2021-05-02 | Discharge: 2021-05-02 | Disposition: A | Discharge status: Home / Self Care | Payer: Medicaid Other | Source: Ambulatory Visit | Attending: Family Medicine | Admitting: Family Medicine

## 2021-05-02 ENCOUNTER — Ambulatory Visit (HOSPITAL_COMMUNITY)
Admission: AD | Admit: 2021-05-02 | Discharge: 2021-05-02 | Disposition: A | Discharge status: Home / Self Care | Payer: Medicaid Other | Attending: Family Medicine | Admitting: Family Medicine

## 2021-05-02 DIAGNOSIS — O34219 Maternal care for unspecified type scar from previous cesarean delivery: Secondary | ICD-10-CM | POA: Diagnosis not present

## 2021-05-02 DIAGNOSIS — O321XX0 Maternal care for breech presentation, not applicable or unspecified: Secondary | ICD-10-CM | POA: Diagnosis not present

## 2021-05-02 DIAGNOSIS — Z3A38 38 weeks gestation of pregnancy: Secondary | ICD-10-CM | POA: Diagnosis not present

## 2021-05-02 DIAGNOSIS — O0990 Supervision of high risk pregnancy, unspecified, unspecified trimester: Secondary | ICD-10-CM

## 2021-05-02 LAB — CBC
HCT: 32.1 % — ABNORMAL LOW (ref 36.0–46.0)
Hemoglobin: 11.1 g/dL — ABNORMAL LOW (ref 12.0–15.0)
MCH: 32.9 pg (ref 26.0–34.0)
MCHC: 34.6 g/dL (ref 30.0–36.0)
MCV: 95.3 fL (ref 80.0–100.0)
Platelets: 188 10*3/uL (ref 150–400)
RBC: 3.37 MIL/uL — ABNORMAL LOW (ref 3.87–5.11)
RDW: 13.4 % (ref 11.5–15.5)
WBC: 8.6 10*3/uL (ref 4.0–10.5)
nRBC: 0 % (ref 0.0–0.2)

## 2021-05-02 LAB — TYPE AND SCREEN
ABO/RH(D): O POS
Antibody Screen: NEGATIVE

## 2021-05-02 MED ORDER — LACTATED RINGERS IV SOLN
INTRAVENOUS | Status: DC
  Filled 2021-05-02: qty 1000

## 2021-05-02 MED ORDER — TERBUTALINE SULFATE 1 MG/ML IJ SOLN
0.2500 mg | Freq: Once | INTRAMUSCULAR | Status: AC
  Administered 2021-05-02: 0.25 mg via SUBCUTANEOUS
  Filled 2021-05-02: qty 1

## 2021-05-02 NOTE — H&P (Signed)
Danielle Singleton is an 26 y.o. [phone] 14w0dfemale.   ?Chief Complaint: Breech presentation ?HPI: Has a baby that has been breech for ECV. ? ?Past Medical History:  ?Diagnosis Date  ? HELLP syndrome 07/16/2015  ? History of pre-eclampsia in prior pregnancy, currently pregnant   ? NEVUS 12/11/2007  ? Qualifier: Diagnosis of  By: MHassell DoneFNP, NTori Milks   ? PIH (pregnancy induced hypertension), previous postpartum condition   ? Postpartum hemorrhage 07/16/2015  ? ? ?Past Surgical History:  ?Procedure Laterality Date  ? CESAREAN SECTION    ? ? ?Family History  ?Problem Relation Age of Onset  ? Diabetes Mother   ? Hypertension Mother   ? Diabetes Father   ? Hypertension Father   ? CAD Father   ? ?Social History:  reports that she has never smoked. She has never used smokeless tobacco. She reports that she does not drink alcohol and does not use drugs. ? ? No Known Allergies ? ?Medications Prior to Admission  ?Medication Sig Dispense Refill  ? aspirin EC 81 MG tablet Take 1 tablet (81 mg total) by mouth daily. Take after 12 weeks for prevention of preeclampsia later in pregnancy 300 tablet 2  ? Blood Pressure Monitoring (BLOOD PRESSURE KIT) DEVI 1 Device by Does not apply route as needed. 1 each 0  ? Misc. Devices (GOJJI WEIGHT SCALE) MISC 1 Device by Does not apply route as needed. 1 each 0  ? Prenatal Vit-Fe Fumarate-FA (MULTIVITAMIN-PRENATAL) 27-0.8 MG TABS tablet Take 1 tablet by mouth daily at 12 noon.    ? ? ? ?A comprehensive review of systems was negative. ? ?Blood pressure 121/74, pulse 86, last menstrual period 08/09/2020, unknown if currently breastfeeding. ?BP 121/74   Pulse 86   LMP 08/09/2020  ?General appearance: alert, cooperative, and appears stated age ?Head: Normocephalic, without obvious abnormality, atraumatic ?Neck: supple, symmetrical, trachea midline ?Lungs:  normal effort ?Heart: regular rate and rhythm ?Abdomen:  gravid Non-tender ?Extremities: extremities normal, atraumatic, no cyanosis or  edema ?Skin: Skin color, texture, turgor normal. No rashes or lesions ?Neurologic: Grossly normal ? ? ?Lab Results  ?Component Value Date  ? WBC 9.0 02/26/2021  ? HGB 11.2 02/26/2021  ? HCT 32.9 (L) 02/26/2021  ? MCV 95 02/26/2021  ? PLT 233 02/26/2021  ? ?  ?    ?  ABO, Rh: O/Positive/-- (10/07 1116)  ?Antibody: Negative (10/07 1116)  ?Rubella: 3.05 (10/07 1116)  ?RPR: Non Reactive (01/19 0818)  ?HBsAg: Negative (10/07 1116)  ?HIV: Non Reactive (01/19 0818)  ?GBS: Negative/-- (03/15 1129)   ? ? ?Prenatal Transfer Tool  ?Maternal Diabetes: No ?Genetic Screening: Normal ?Maternal Ultrasounds/Referrals: Normal ?Fetal Ultrasounds or other Referrals:  None ?Maternal Substance Abuse:  No ?Significant Maternal Medications:  Meds include: Other: ASA ?Significant Maternal Lab Results: Group B Strep negative ? ? ?Assessment/Plan ?Principal Problem: ?  Breech presentation ? ?For ECV ?Risks reviewed. ? ?TDonnamae Jude?05/02/2021, 8:37 AM ? ? ?

## 2021-05-02 NOTE — Progress Notes (Signed)
Patient ID: Danielle Singleton, female   DOB: 22-Jan-1996, 26 y.o.   MRN: [id number] ?After informed verbal consent, Terbutaline 0.25 mg SQ given, ECV was attempted under Ultrasound guidance.  Forward roll attempted x 5. Could not get head past transverse.   ?FHR was reactive before and after the procedure.   ?Pt. Tolerated the procedure well. ? ?

## 2021-05-02 NOTE — Progress Notes (Signed)
Patient arrived for version. ?

## 2021-05-07 ENCOUNTER — Ambulatory Visit (INDEPENDENT_AMBULATORY_CARE_PROVIDER_SITE_OTHER): Payer: Medicaid Other | Admitting: Obstetrics & Gynecology

## 2021-05-07 VITALS — BP 115/78 | HR 108 | Wt 208.2 lb

## 2021-05-07 DIAGNOSIS — O0990 Supervision of high risk pregnancy, unspecified, unspecified trimester: Secondary | ICD-10-CM

## 2021-05-07 DIAGNOSIS — O321XX0 Maternal care for breech presentation, not applicable or unspecified: Secondary | ICD-10-CM

## 2021-05-07 DIAGNOSIS — O34219 Maternal care for unspecified type scar from previous cesarean delivery: Secondary | ICD-10-CM

## 2021-05-07 NOTE — Progress Notes (Signed)
? ?  PRENATAL VISIT NOTE ? ?Subjective:  ?Danielle Singleton is a 26 y.o. G3P1102 at 30w5dbeing seen today for ongoing prenatal care.  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 ? ?Patient reports no complaints.  Contractions: Irregular. Vag. Bleeding: None.  Movement: Present. Denies leaking of fluid.  ? ?The following portions of the patient's history were reviewed and updated as appropriate: allergies, current medications, past family history, past medical history, past social history, past surgical history and problem list.  ? ?Objective:  ? ?Vitals:  ? 05/07/21 1432  ?BP: 115/78  ?Pulse: (!) 108  ?Weight: 208 lb 3.2 oz (94.4 kg)  ? ? ?Fetal Status: Fetal Heart Rate (bpm): 140   Movement: Present    ? ?General:  Alert, oriented and cooperative. Patient is in no acute distress.  ?Skin: Skin is warm and dry. No rash noted.   ?Cardiovascular: Normal heart rate noted  ?Respiratory: Normal respiratory effort, no problems with respiration noted  ?Abdomen: Soft, gravid, appropriate for gestational age.  Pain/Pressure: Present     ?Pelvic: Cervical exam deferred        ?Extremities: Normal range of motion.     ?Mental Status: Normal mood and affect. Normal behavior. Normal judgment and thought content.  ? ?Assessment and Plan:  ?Pregnancy: GD1V6160at 326w5d1. Breech presentation, single or unspecified fetus ?Breech, attempT ECV vs RCS 4/3 per her request offered 4/1 39 weeks ? ?2. Hx successful VBAC (vaginal birth after cesarean), currently pregnant ? ? ?3. Supervision of high risk pregnancy, antepartum ? ? ?Term labor symptoms and general obstetric precautions including but not  limited to vaginal bleeding, contractions, leaking of fluid and fetal movement were reviewed in detail with the patient. ?Please refer to After Visit Summary for other counseling recommendations.  ? ?Return if symptoms worsen or fail to improve. ? ?Future Appointments  ?Date Time Provider [place]?05/08/2021  8:30 AM [facility] NURSE [facility]  ?05/08/2021  8:45 AM [facility] US6 [facility] [place]?05/13/2021  1:15 PM StNehemiah SettleaTanna SavoyDO [facility]? ? ?JaEmeterio ReeveMD ?

## 2021-05-08 ENCOUNTER — Encounter: Payer: Self-pay | Admitting: *Deleted

## 2021-05-08 ENCOUNTER — Ambulatory Visit: Payer: Medicaid Other | Admitting: *Deleted

## 2021-05-08 ENCOUNTER — Ambulatory Visit: Payer: Medicaid Other | Attending: Obstetrics

## 2021-05-08 VITALS — BP 132/72 | HR 76

## 2021-05-08 DIAGNOSIS — O365930 Maternal care for other known or suspected poor fetal growth, third trimester, not applicable or unspecified: Secondary | ICD-10-CM

## 2021-05-08 DIAGNOSIS — O365931 Maternal care for other known or suspected poor fetal growth, third trimester, fetus 1: Secondary | ICD-10-CM | POA: Insufficient documentation

## 2021-05-08 DIAGNOSIS — O0990 Supervision of high risk pregnancy, unspecified, unspecified trimester: Secondary | ICD-10-CM | POA: Insufficient documentation

## 2021-05-08 DIAGNOSIS — O09213 Supervision of pregnancy with history of pre-term labor, third trimester: Secondary | ICD-10-CM | POA: Diagnosis not present

## 2021-05-08 DIAGNOSIS — O43193 Other malformation of placenta, third trimester: Secondary | ICD-10-CM | POA: Diagnosis not present

## 2021-05-08 DIAGNOSIS — O34219 Maternal care for unspecified type scar from previous cesarean delivery: Secondary | ICD-10-CM | POA: Diagnosis not present

## 2021-05-08 DIAGNOSIS — Z3A38 38 weeks gestation of pregnancy: Secondary | ICD-10-CM

## 2021-05-08 DIAGNOSIS — O09293 Supervision of pregnancy with other poor reproductive or obstetric history, third trimester: Secondary | ICD-10-CM

## 2021-05-09 ENCOUNTER — Encounter: Payer: Self-pay | Admitting: Obstetrics & Gynecology

## 2021-05-11 ENCOUNTER — Telehealth (HOSPITAL_COMMUNITY): Payer: Self-pay | Admitting: *Deleted

## 2021-05-11 ENCOUNTER — Telehealth: Payer: Self-pay | Admitting: Family Medicine

## 2021-05-11 ENCOUNTER — Telehealth: Payer: Self-pay

## 2021-05-11 ENCOUNTER — Encounter (HOSPITAL_COMMUNITY): Payer: Self-pay | Admitting: *Deleted

## 2021-05-11 NOTE — Telephone Encounter (Signed)
Preadmission screen  

## 2021-05-11 NOTE — Telephone Encounter (Signed)
Called patient, version date and time given, surgery date and time given if version fails, preop instructions given. Patient expressed understanding. ?

## 2021-05-11 NOTE — Telephone Encounter (Signed)
Patient called in wanting to know about her induction date, she is unsure if it is today or tomorrow and she has not received any calls about it.  ?

## 2021-05-11 NOTE — Telephone Encounter (Signed)
Called pt and discussed her concern. Pt has been contacted by Janett Labella and is aware of appt on 4/5 for version followed by induction of labor if successful or C/S if not successful. She had not additional questions ?

## 2021-05-12 ENCOUNTER — Inpatient Hospital Stay (HOSPITAL_COMMUNITY): Admission: RE | Admit: 2021-05-12 | Payer: Medicaid Other | Source: Ambulatory Visit

## 2021-05-13 ENCOUNTER — Inpatient Hospital Stay (HOSPITAL_COMMUNITY)
Admission: RE | Admit: 2021-05-13 | Discharge: 2021-05-15 | DRG: 807 | Disposition: A | Discharge status: Home / Self Care | Payer: Medicaid Other | Attending: Obstetrics & Gynecology | Admitting: Obstetrics & Gynecology

## 2021-05-13 ENCOUNTER — Encounter (HOSPITAL_COMMUNITY): Payer: Self-pay | Admitting: Anesthesiology

## 2021-05-13 ENCOUNTER — Inpatient Hospital Stay (HOSPITAL_COMMUNITY): Admission: AD | Admit: 2021-05-13 | Payer: Medicaid Other | Source: Home / Self Care | Admitting: Family Medicine

## 2021-05-13 ENCOUNTER — Encounter (HOSPITAL_COMMUNITY): Payer: Self-pay | Admitting: Obstetrics & Gynecology

## 2021-05-13 ENCOUNTER — Inpatient Hospital Stay (HOSPITAL_COMMUNITY): Payer: Medicaid Other

## 2021-05-13 ENCOUNTER — Other Ambulatory Visit: Payer: Self-pay

## 2021-05-13 ENCOUNTER — Encounter (HOSPITAL_COMMUNITY)
Admission: RE | Disposition: A | Discharge status: Home / Self Care | Payer: Self-pay | Source: Home / Self Care | Attending: Obstetrics & Gynecology

## 2021-05-13 ENCOUNTER — Encounter: Payer: Medicaid Other | Admitting: Family Medicine

## 2021-05-13 ENCOUNTER — Encounter (HOSPITAL_COMMUNITY): Payer: Medicaid Other

## 2021-05-13 DIAGNOSIS — O321XX0 Maternal care for breech presentation, not applicable or unspecified: Secondary | ICD-10-CM | POA: Diagnosis present

## 2021-05-13 DIAGNOSIS — O365930 Maternal care for other known or suspected poor fetal growth, third trimester, not applicable or unspecified: Secondary | ICD-10-CM | POA: Diagnosis present

## 2021-05-13 DIAGNOSIS — Z3A39 39 weeks gestation of pregnancy: Secondary | ICD-10-CM | POA: Diagnosis not present

## 2021-05-13 DIAGNOSIS — O34219 Maternal care for unspecified type scar from previous cesarean delivery: Secondary | ICD-10-CM | POA: Diagnosis not present

## 2021-05-13 DIAGNOSIS — O328XX0 Maternal care for other malpresentation of fetus, not applicable or unspecified: Secondary | ICD-10-CM | POA: Diagnosis present

## 2021-05-13 DIAGNOSIS — O0990 Supervision of high risk pregnancy, unspecified, unspecified trimester: Secondary | ICD-10-CM

## 2021-05-13 DIAGNOSIS — O09299 Supervision of pregnancy with other poor reproductive or obstetric history, unspecified trimester: Secondary | ICD-10-CM

## 2021-05-13 DIAGNOSIS — O34211 Maternal care for low transverse scar from previous cesarean delivery: Secondary | ICD-10-CM | POA: Diagnosis not present

## 2021-05-13 DIAGNOSIS — Z30430 Encounter for insertion of intrauterine contraceptive device: Secondary | ICD-10-CM

## 2021-05-13 DIAGNOSIS — O139 Gestational [pregnancy-induced] hypertension without significant proteinuria, unspecified trimester: Secondary | ICD-10-CM

## 2021-05-13 DIAGNOSIS — O134 Gestational [pregnancy-induced] hypertension without significant proteinuria, complicating childbirth: Secondary | ICD-10-CM | POA: Diagnosis present

## 2021-05-13 LAB — CBC
HCT: 31.8 % — ABNORMAL LOW (ref 36.0–46.0)
Hemoglobin: 11 g/dL — ABNORMAL LOW (ref 12.0–15.0)
MCH: 32.7 pg (ref 26.0–34.0)
MCHC: 34.6 g/dL (ref 30.0–36.0)
MCV: 94.6 fL (ref 80.0–100.0)
Platelets: 157 10*3/uL (ref 150–400)
RBC: 3.36 MIL/uL — ABNORMAL LOW (ref 3.87–5.11)
RDW: 13.5 % (ref 11.5–15.5)
WBC: 8.4 10*3/uL (ref 4.0–10.5)
nRBC: 0 % (ref 0.0–0.2)

## 2021-05-13 LAB — TYPE AND SCREEN
ABO/RH(D): O POS
Antibody Screen: NEGATIVE

## 2021-05-13 LAB — RPR: RPR Ser Ql: NONREACTIVE

## 2021-05-13 SURGERY — Surgical Case
Anesthesia: Regional

## 2021-05-13 MED ORDER — LACTATED RINGERS IV SOLN: 500.0000 mL | INTRAVENOUS | Status: DC | PRN

## 2021-05-13 MED ORDER — LACTATED RINGERS IV SOLN: INTRAVENOUS | Status: DC

## 2021-05-13 MED ORDER — TERBUTALINE SULFATE 1 MG/ML IJ SOLN
INTRAMUSCULAR | Status: AC
  Filled 2021-05-13: qty 1

## 2021-05-13 MED ORDER — OXYTOCIN BOLUS FROM INFUSION: 333.0000 mL | Freq: Once | INTRAVENOUS | Status: DC

## 2021-05-13 MED ORDER — ZOLPIDEM TARTRATE 5 MG PO TABS
5.0000 mg | ORAL_TABLET | Freq: Every evening | ORAL | Status: DC | PRN
  Administered 2021-05-13: 5 mg via ORAL
  Filled 2021-05-13: qty 1

## 2021-05-13 MED ORDER — ONDANSETRON HCL 4 MG/2ML IJ SOLN: 4.0000 mg | Freq: Four times a day (QID) | INTRAMUSCULAR | Status: DC | PRN

## 2021-05-13 MED ORDER — ACETAMINOPHEN 325 MG PO TABS: 650.0000 mg | ORAL_TABLET | ORAL | Status: DC | PRN

## 2021-05-13 MED ORDER — SOD CITRATE-CITRIC ACID 500-334 MG/5ML PO SOLN: 30.0000 mL | ORAL | Status: DC | PRN

## 2021-05-13 MED ORDER — OXYTOCIN-SODIUM CHLORIDE 30-0.9 UT/500ML-% IV SOLN: 2.5000 [IU]/h | INTRAVENOUS | Status: DC

## 2021-05-13 MED ORDER — OXYTOCIN-SODIUM CHLORIDE 30-0.9 UT/500ML-% IV SOLN
1.0000 m[IU]/min | INTRAVENOUS | Status: DC
  Administered 2021-05-13: 1 m[IU]/min via INTRAVENOUS
  Filled 2021-05-13: qty 500

## 2021-05-13 MED ORDER — TERBUTALINE SULFATE 1 MG/ML IJ SOLN: 0.2500 mg | Freq: Once | INTRAMUSCULAR | Status: DC | PRN

## 2021-05-13 MED ORDER — OXYCODONE-ACETAMINOPHEN 5-325 MG PO TABS: 1.0000 | ORAL_TABLET | ORAL | Status: DC | PRN

## 2021-05-13 MED ORDER — TERBUTALINE SULFATE 1 MG/ML IJ SOLN
0.2500 mg | Freq: Once | INTRAMUSCULAR | Status: AC
  Administered 2021-05-13: 0.25 mg via SUBCUTANEOUS

## 2021-05-13 MED ORDER — OXYCODONE-ACETAMINOPHEN 5-325 MG PO TABS: 2.0000 | ORAL_TABLET | ORAL | Status: DC | PRN

## 2021-05-13 MED ORDER — LIDOCAINE HCL (PF) 1 % IJ SOLN
30.0000 mL | INTRAMUSCULAR | Status: DC | PRN
Start: 2021-05-13 — End: 2021-05-14
  Filled 2021-05-13: qty 30

## 2021-05-13 NOTE — Progress Notes (Signed)
Patient ID: Danielle Singleton, female   DOB: Mar 24, 1995, 26 y.o.   MRN: [id number] ? ?In to introduce myself to pt and review plan; denies pain; Pitocin stopped at 1845 due to other emergent situation ? ?BPs 140/80, 136/76, 130/74 ?FHR 155-160s, +accels, no decels ?Irreg ctx ?Cx deferred ? ?IUP'@39'$ .4wks ?TOLAC ?Breech presentation ?One borderline BP ?Cx unfavorable ? ?Pt to rest overnight until approx 0500 when a foley balloon will be placed and Pitocin restarted; plan on Ambien '5mg'$  for sleep and also changed to NST bid status; may shower in the morning; pt and spouse agreeable w plan; continue to watch BP ? ?Myrtis Ser CNM ?05/13/2021 ?9:42 PM ? ? ?

## 2021-05-13 NOTE — H&P (Signed)
Obstetric  History and Physical ? ?Danielle Singleton is a 26 y.o. [phone] with IUP at [redacted]w[redacted]d presenting for presenting for scheduled ECV attempt with CS to follow. Had attempted version at 37 wk with Dr Kennon Rounds that was not successful.  No acute concerns.  ? ?Prenatal Course ?Source of Care: [facility]  with onset of care at [redacted]w[redacted]d ? ?Pregnancy complications or risks: ?Patient Active Problem List  ? Diagnosis Date Noted  ? Breech presentation 04/30/2021  ? IUGR (intrauterine growth restriction) affecting care of mother 02/26/2021  ? Supervision of high risk pregnancy, antepartum 11/14/2020  ? Hx successful VBAC (vaginal birth after cesarean), currently pregnant 11/14/2020  ? 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 ?She plans to breastfeed ?She desires IUD for postpartum contraception. Desires placement postplacental in CS or SVD ? ?Prenatal labs and studies: ?ABO, Rh: --/--/O POS (04/05 3419) ?Antibody: NEG (04/05 3790) ?Rubella: 3.05 (10/07 1116) ?RPR: Non Reactive (01/19 0818)  ?HBsAg: Negative (10/07 1116)  ?HIV: Non Reactive (01/19 0818)  ?WIO:XBDZHGDJ/-- (03/15 1129) ?2 hr Glucola  WNL ?Genetic screening normal ?Anatomy US normal ? ?Prenatal Transfer Tool  ?Maternal Diabetes: No ?Genetic Screening: Normal ?Maternal Ultrasounds/Referrals: Normal, monitored for FGR that resolved at 32 weeks with EFW 16th% ?Fetal Ultrasounds or other Referrals:  None ?Maternal Substance Abuse:  No ?Significant Maternal Medications:  None ?Significant Maternal Lab Results: Group B Strep negative ? ?Past Medical History:  ?Diagnosis Date  ? HELLP syndrome 07/16/2015  ? History of pre-eclampsia in prior pregnancy, currently pregnant   ? NEVUS 12/11/2007  ? Qualifier: Diagnosis of  By: Hassell Done FNP, Tori Milks    ? PIH (pregnancy induced hypertension), previous postpartum condition   ? Postpartum hemorrhage 07/16/2015  ? ? ?Past Surgical History:  ?Procedure Laterality Date  ? CESAREAN SECTION    ? ? ?OB History   ?Gravida Para Term Preterm AB Living  ?$Remove'3 2 1 1 'vgxSfjq$ 0 2  ?SAB IAB Ectopic Multiple Live Births  ?0 0 0 0 2  ?  ?# Outcome Date GA Lbr Len/2nd Weight Sex Delivery Anes PTL Lv  ?3 Current           ?2 Preterm 07/13/15 [redacted]w[redacted]d 09:21 / 00:12 1990 g F Vag-Spont None  LIV  ?1 Term 05/11/10 [redacted]w[redacted]d   F CS-LTranv Spinal N LIV  ? ? ?Social History  ? ?Socioeconomic History  ? Marital status: Married  ?  Spouse name: Not on file  ? Number of children: Not on file  ? Years of education: Not on file  ? Highest education level: 10th grade  ?Occupational History  ? Not on file  ?Tobacco Use  ? Smoking status: Never  ? Smokeless tobacco: Never  ?Vaping Use  ? Vaping Use: Never used  ?Substance and Sexual Activity  ? Alcohol use: No  ? Drug use: No  ? Sexual activity: Yes  ?Other Topics Concern  ? Not on file  ?Social History Narrative  ? Not on file  ? ?Social Determinants of Health  ? ?Financial Resource Strain: Not on file  ?Food Insecurity: No Food Insecurity  ? Worried About Charity fundraiser in the Last Year: Never true  ? Ran Out of Food in the Last Year: Never true  ?Transportation Needs: No Transportation Needs  ? Lack of Transportation (Medical): No  ? Lack of Transportation (Non-Medical): No  ?Physical Activity: Not on file  ?Stress: Not on file  ?Social Connections: Not on file  ? ? ?Family  History  ?Problem Relation Age of Onset  ? Diabetes Mother   ? Hypertension Mother   ? Diabetes Father   ? Hypertension Father   ? CAD Father   ? ? ?Medications Prior to Admission  ?Medication Sig Dispense Refill Last Dose  ? aspirin EC 81 MG tablet Take 1 tablet (81 mg total) by mouth daily. Take after 12 weeks for prevention of preeclampsia later in pregnancy 300 tablet 2   ? calcium carbonate (TUMS - DOSED IN MG ELEMENTAL CALCIUM) 500 MG chewable tablet Chew 1 tablet by mouth 4 (four) times daily as needed for indigestion or heartburn.     ? Prenatal Vit-Fe Fumarate-FA (MULTIVITAMIN-PRENATAL) 27-0.8 MG TABS tablet Take 1 tablet by mouth  daily.     ? Blood Pressure Monitoring (BLOOD PRESSURE KIT) DEVI 1 Device by Does not apply route as needed. 1 each 0   ? Misc. Devices (GOJJI WEIGHT SCALE) MISC 1 Device by Does not apply route as needed. 1 each 0   ? ? ?No Known Allergies ? ?Review of Systems: Negative except for what is mentioned in HPI. ? ?Physical Exam: ?BP 135/74   Pulse 97   Temp 98.8 ?F (37.1 ?C) (Oral)   Resp 16   Ht $R'5\' 1"'Kw$  (1.549 m)   Wt 94.3 kg   LMP 08/09/2020   SpO2 100%   BMI 39.30 kg/m?  ?FHR by Doppler: 145 bpm ?CONSTITUTIONAL: Well-developed, well-nourished female in no acute distress.  ?HENT:  Normocephalic, atraumatic, External right and left ear normal. Oropharynx is clear and moist ?EYES: Conjunctivae and EOM are normal. Pupils are equal, round, and reactive to light. No scleral icterus.  ?NECK: Normal range of motion, supple, no masses ?SKIN: Skin is warm and dry. No rash noted. Not diaphoretic. No erythema. No pallor. ?[place]: Alert and oriented to person, place, and time. Normal reflexes, muscle tone coordination. No cranial nerve deficit noted. ?PSYCHIATRIC: Normal mood and affect. Normal behavior. Normal judgment and thought content. ?CARDIOVASCULAR: Normal heart rate noted, regular rhythm ?RESPIRATORY: Effort and breath sounds normal, no problems with respiration noted ?ABDOMEN: Soft, nontender, nondistended, gravid. Well-healed Pfannenstiel incision. ?PELVIC: Deferred ?MUSCULOSKELETAL: Normal range of motion. No edema and no tenderness. 2+ distal pulses. ? ? ?Pertinent Labs/Studies:   ?Results for orders placed or performed during the hospital encounter of 05/13/21 (from the past 72 hour(s))  ?CBC     Status: Abnormal  ? Collection Time: 05/13/21  7:42 AM  ?Result Value Ref Range  ? WBC 8.4 4.0 - 10.5 K/uL  ? RBC 3.36 (L) 3.87 - 5.11 MIL/uL  ? Hemoglobin 11.0 (L) 12.0 - 15.0 g/dL  ? HCT 31.8 (L) 36.0 - 46.0 %  ? MCV 94.6 80.0 - 100.0 fL  ? MCH 32.7 26.0 - 34.0 pg  ? MCHC 34.6 30.0 - 36.0 g/dL  ? RDW 13.5 11.5 -  15.5 %  ? Platelets 157 150 - 400 K/uL  ? nRBC 0.0 0.0 - 0.2 %  ?  Comment: Performed at [facility], [place] [address] [place], [place] [address]  ?Type and screen [place]     Status: None  ? Collection Time: 05/13/21  7:42 AM  ?Result Value Ref Range  ? ABO/RH(D) O POS   ? Antibody Screen NEG   ? Sample Expiration    ?  05/16/2021,2359 ?Performed at [facility], [place] [address], [place], [place] [address] ?  ? ? ? ?Procedure: External Cephalic Version ?After informed verbal consent,  patient signed consent form.  ?RN administered Terbutaline 0.25 mg SQ ? ?Patient was placed in the supine position. ECV was attempted under Ultrasound guidance.  ECV was unsuccessfully performed.  ? ?FHR was reactive before and after the procedure.   ?Pt. Tolerated the procedure well. ? ? ?Assessment and Plan: ? ?Discussed with patient vaginal breech attempt after discussing with my colleague Dr. Darron Doom.  ? ?During this discussion with patient we: ?- Reviewed risk of vaginal breech delivery which include emergent C-section, head entrapment and NICU admission. Patient has delivered vaginally 1990g infant at 50 weeks. This infant was 16th% at 32 weeks, weighing 2703g.  ?- Reviewed risk of repeat CS in detail including infection, bleeding, injury to bowel/bladder/ureters, placental abnormalities and complications in future pregnancies. We also discussed the patient's desired family size and she does want at least 1 more child.  ?- Patient voiced understanding about risks of both breech delivery and repeat C-section. She and partner reviewed options and patient expressed and desire to have vaginal breech delivery.  ?- Discussed induction of labor and plan for low dose pitocin and FB for ripening ?- Dr. Kennon Rounds aware and is second attending tomorrow and willing to come in for delivery overnight if needed  ? ?Labor: induction with FB and low dose pit ?FW: Cat 1, BREECH ?ID: GBS negative ?Reproductive life  plan: Desire LNG IUD placement with CS or vaginal delivery ? ? ?Caren Macadam, MD, MPH, ABFM ?Attending Physician ?[facility]  ? ?

## 2021-05-14 ENCOUNTER — Inpatient Hospital Stay (HOSPITAL_COMMUNITY): Payer: Medicaid Other | Admitting: Anesthesiology

## 2021-05-14 ENCOUNTER — Encounter (HOSPITAL_COMMUNITY): Payer: Self-pay | Admitting: Obstetrics & Gynecology

## 2021-05-14 DIAGNOSIS — O34219 Maternal care for unspecified type scar from previous cesarean delivery: Secondary | ICD-10-CM | POA: Diagnosis not present

## 2021-05-14 DIAGNOSIS — Z3A39 39 weeks gestation of pregnancy: Secondary | ICD-10-CM

## 2021-05-14 DIAGNOSIS — Z30430 Encounter for insertion of intrauterine contraceptive device: Secondary | ICD-10-CM

## 2021-05-14 DIAGNOSIS — O134 Gestational [pregnancy-induced] hypertension without significant proteinuria, complicating childbirth: Secondary | ICD-10-CM

## 2021-05-14 DIAGNOSIS — O328XX0 Maternal care for other malpresentation of fetus, not applicable or unspecified: Secondary | ICD-10-CM

## 2021-05-14 DIAGNOSIS — O34211 Maternal care for low transverse scar from previous cesarean delivery: Secondary | ICD-10-CM

## 2021-05-14 LAB — CBC
HCT: 31 % — ABNORMAL LOW (ref 36.0–46.0)
Hemoglobin: 10.5 g/dL — ABNORMAL LOW (ref 12.0–15.0)
MCH: 32.1 pg (ref 26.0–34.0)
MCHC: 33.9 g/dL (ref 30.0–36.0)
MCV: 94.8 fL (ref 80.0–100.0)
Platelets: 150 10*3/uL (ref 150–400)
RBC: 3.27 MIL/uL — ABNORMAL LOW (ref 3.87–5.11)
RDW: 13.6 % (ref 11.5–15.5)
WBC: 7.9 10*3/uL (ref 4.0–10.5)
nRBC: 0 % (ref 0.0–0.2)

## 2021-05-14 LAB — COMPREHENSIVE METABOLIC PANEL
ALT: 24 U/L (ref 0–44)
AST: 29 U/L (ref 15–41)
Albumin: 2.5 g/dL — ABNORMAL LOW (ref 3.5–5.0)
Alkaline Phosphatase: 88 U/L (ref 38–126)
Anion gap: 7 (ref 5–15)
BUN: 5 mg/dL — ABNORMAL LOW (ref 6–20)
CO2: 20 mmol/L — ABNORMAL LOW (ref 22–32)
Calcium: 8.4 mg/dL — ABNORMAL LOW (ref 8.9–10.3)
Chloride: 110 mmol/L (ref 98–111)
Creatinine, Ser: 0.47 mg/dL (ref 0.44–1.00)
GFR, Estimated: >60 mL/min (ref >60)
Glucose, Bld: 109 mg/dL — ABNORMAL HIGH (ref 70–99)
Potassium: 3.3 mmol/L — ABNORMAL LOW (ref 3.5–5.1)
Sodium: 137 mmol/L (ref 135–145)
Total Bilirubin: 0.2 mg/dL — ABNORMAL LOW (ref 0.3–1.2)
Total Protein: 5.6 g/dL — ABNORMAL LOW (ref 6.5–8.1)

## 2021-05-14 MED ORDER — SIMETHICONE 80 MG PO CHEW: 80.0000 mg | CHEWABLE_TABLET | ORAL | Status: DC | PRN

## 2021-05-14 MED ORDER — DIPHENHYDRAMINE HCL 50 MG/ML IJ SOLN: 12.5000 mg | INTRAMUSCULAR | Status: DC | PRN

## 2021-05-14 MED ORDER — OXYTOCIN-SODIUM CHLORIDE 30-0.9 UT/500ML-% IV SOLN
1.0000 m[IU]/min | INTRAVENOUS | Status: DC
  Administered 2021-05-14: 2 m[IU]/min via INTRAVENOUS

## 2021-05-14 MED ORDER — ONDANSETRON HCL 4 MG PO TABS: 4.0000 mg | ORAL_TABLET | ORAL | Status: DC | PRN

## 2021-05-14 MED ORDER — FENTANYL-BUPIVACAINE-NACL 0.5-0.125-0.9 MG/250ML-% EP SOLN
EPIDURAL | Status: AC
  Filled 2021-05-14: qty 250

## 2021-05-14 MED ORDER — OXYTOCIN-SODIUM CHLORIDE 30-0.9 UT/500ML-% IV SOLN: 2.5000 [IU]/h | INTRAVENOUS | Status: DC

## 2021-05-14 MED ORDER — COCONUT OIL OIL: 1.0000 "application " | TOPICAL_OIL | Status: DC | PRN

## 2021-05-14 MED ORDER — FENTANYL-BUPIVACAINE-NACL 0.5-0.125-0.9 MG/250ML-% EP SOLN
12.0000 mL/h | EPIDURAL | Status: DC | PRN
  Administered 2021-05-14: 12 mL/h via EPIDURAL

## 2021-05-14 MED ORDER — FENTANYL-BUPIVACAINE-NACL 0.5-0.125-0.9 MG/250ML-% EP SOLN: 12.0000 mL/h | EPIDURAL | Status: DC | PRN

## 2021-05-14 MED ORDER — METHYLERGONOVINE MALEATE 0.2 MG/ML IJ SOLN
0.2000 mg | Freq: Once | INTRAMUSCULAR | Status: AC
  Administered 2021-05-14: 0.2 mg via INTRAMUSCULAR

## 2021-05-14 MED ORDER — ONDANSETRON HCL 4 MG/2ML IJ SOLN: 4.0000 mg | INTRAMUSCULAR | Status: DC | PRN

## 2021-05-14 MED ORDER — BENZOCAINE-MENTHOL 20-0.5 % EX AERO
1.0000 "application " | INHALATION_SPRAY | CUTANEOUS | Status: DC | PRN
  Administered 2021-05-14: 1 via TOPICAL
  Filled 2021-05-14: qty 56

## 2021-05-14 MED ORDER — PHENYLEPHRINE 40 MCG/ML (10ML) SYRINGE FOR IV PUSH (FOR BLOOD PRESSURE SUPPORT): 80.0000 ug | PREFILLED_SYRINGE | INTRAVENOUS | Status: DC | PRN

## 2021-05-14 MED ORDER — IBUPROFEN 600 MG PO TABS
600.0000 mg | ORAL_TABLET | Freq: Four times a day (QID) | ORAL | Status: DC
  Administered 2021-05-14 – 2021-05-15 (×4): 600 mg via ORAL
  Filled 2021-05-14 (×4): qty 1

## 2021-05-14 MED ORDER — SENNOSIDES-DOCUSATE SODIUM 8.6-50 MG PO TABS
2.0000 | ORAL_TABLET | Freq: Every day | ORAL | Status: DC
  Administered 2021-05-15: 2 via ORAL
  Filled 2021-05-14: qty 2

## 2021-05-14 MED ORDER — EPHEDRINE 5 MG/ML INJ: 10.0000 mg | INTRAVENOUS | Status: DC | PRN

## 2021-05-14 MED ORDER — WITCH HAZEL-GLYCERIN EX PADS: 1.0000 "application " | MEDICATED_PAD | CUTANEOUS | Status: DC | PRN

## 2021-05-14 MED ORDER — LIDOCAINE-EPINEPHRINE (PF) 2 %-1:200000 IJ SOLN
INTRAMUSCULAR | Status: DC | PRN
  Administered 2021-05-14: 5 mL via EPIDURAL

## 2021-05-14 MED ORDER — TRANEXAMIC ACID-NACL 1000-0.7 MG/100ML-% IV SOLN
1000.0000 mg | Freq: Once | INTRAVENOUS | Status: AC
  Administered 2021-05-14: 1000 mg via INTRAVENOUS
  Filled 2021-05-14: qty 100

## 2021-05-14 MED ORDER — METHYLERGONOVINE MALEATE 0.2 MG/ML IJ SOLN
INTRAMUSCULAR | Status: AC
Start: 2021-05-14 — End: 2021-05-14
  Filled 2021-05-14: qty 1

## 2021-05-14 MED ORDER — LACTATED RINGERS IV SOLN: 500.0000 mL | Freq: Once | INTRAVENOUS | Status: DC

## 2021-05-14 MED ORDER — ACETAMINOPHEN 325 MG PO TABS: 650.0000 mg | ORAL_TABLET | ORAL | Status: DC | PRN

## 2021-05-14 MED ORDER — PRENATAL MULTIVITAMIN CH
1.0000 | ORAL_TABLET | Freq: Every day | ORAL | Status: DC
  Administered 2021-05-14: 1 via ORAL
  Filled 2021-05-14: qty 1

## 2021-05-14 MED ORDER — DIBUCAINE (PERIANAL) 1 % EX OINT: 1.0000 "application " | TOPICAL_OINTMENT | CUTANEOUS | Status: DC | PRN

## 2021-05-14 MED ORDER — DIPHENHYDRAMINE HCL 25 MG PO CAPS: 25.0000 mg | ORAL_CAPSULE | Freq: Four times a day (QID) | ORAL | Status: DC | PRN

## 2021-05-14 MED ORDER — LEVONORGESTREL 20 MCG/DAY IU IUD
1.0000 | INTRAUTERINE_SYSTEM | Freq: Once | INTRAUTERINE | Status: AC
  Administered 2021-05-14: 1 via INTRAUTERINE
  Filled 2021-05-14: qty 1

## 2021-05-14 MED ORDER — OXYTOCIN BOLUS FROM INFUSION
333.0000 mL | Freq: Once | INTRAVENOUS | Status: AC
  Administered 2021-05-14: 333 mL via INTRAVENOUS

## 2021-05-14 NOTE — Lactation Note (Signed)
This note was copied from a baby's chart. ?Lactation Consultation Note ? ?Patient Name: Danielle Singleton ?Today's Date: 05/14/2021 ?Reason for consult: Initial assessment;Term ?Age:28 hours ? ? ?P3 mother whose infant is now 9 hours old.  This is a term baby at 39+5 weeks.  Mother breast fed her other two children (now 80 and 26 years old) for 3 months each.  Her current feeding preference is breast/formula. ? ?Mother has breast fed without difficulty since delivery; denies pain with feeding.  Breast feeding basics reviewed.  Encouraged feeding 8-12 times/24 hours or sooner if baby shows cues.  Suggested mother call her RN/LC for latch assistance as needed. ? ?Father present and asleep on the couch. ? ? ?Maternal Data ?Has patient been taught Hand Expression?: Yes ?Does the patient have breastfeeding experience prior to this delivery?: Yes ?How long did the patient breastfeed?: 3 months with her other two children ? ?Feeding ?Mother's Current Feeding Choice: Breast Milk and Formula ? ?LATCH Score ?Latch: Grasps breast easily, tongue down, lips flanged, rhythmical sucking. ? ?Audible Swallowing: A few with stimulation ? ?Type of Nipple: Everted at rest and after stimulation ? ?Comfort (Breast/Nipple): Soft / non-tender ? ?Hold (Positioning): No assistance needed to correctly position infant at breast. ? ?LATCH Score: 9 ? ? ?Lactation Tools Discussed/Used ?  ? ?Interventions ?Interventions: Breast feeding basics reviewed;Education;LC Services brochure ? ?Discharge ?  ? ?Consult Status ?Consult Status: Follow-up ?Date: 05/15/21 ?Follow-up type: In-patient ? ? ? ?Thomasene Dubow R Daray Polgar ?05/14/2021, 8:19 AM ? ? ? ?

## 2021-05-14 NOTE — Anesthesia Procedure Notes (Signed)
Epidural ?Patient location during procedure: OB ?Start time: 05/14/2021 1:35 AM ?End time: 05/14/2021 1:39 AM ? ?Staffing ?Anesthesiologist: Effie Berkshire, MD ?Performed: anesthesiologist  ? ?Preanesthetic Checklist ?Completed: patient identified, IV checked, site marked, risks and benefits discussed, surgical consent, monitors and equipment checked, pre-op evaluation and timeout performed ? ?Epidural ?Patient position: sitting ?Prep: DuraPrep ?Patient monitoring: heart rate, continuous pulse ox and blood pressure ?Approach: midline ?Location: L3-L4 ?Injection technique: LOR saline ? ?Needle:  ?Needle type: Tuohy  ?Needle gauge: 17 G ?Needle length: 9 cm ?Catheter type: closed end flexible ?Catheter size: 20 Guage ?Test dose: negative and 1.5% lidocaine ? ?Assessment ?Events: blood not aspirated, injection not painful, no injection resistance and no paresthesia ? ?Additional Notes ?LOR @ 5.5 ? ?1st attempt: poor position, LOR @ 4.5, heme aspirated, catheter removed. ?2nd attempt: pt repositioned, LOR @ 5.5, negative aspiration. ? ?Patient identified. Risks/Benefits/Options discussed with patient including but not limited to bleeding, infection, nerve damage, paralysis, failed block, incomplete pain control, headache, blood pressure changes, nausea, vomiting, reactions to medications, itching and postpartum back pain. Confirmed with bedside nurse the patient's most recent platelet count. Confirmed with patient that they are not currently taking any anticoagulation, have any bleeding history or any family history of bleeding disorders. Patient expressed understanding and wished to proceed. All questions were answered. Sterile technique was used throughout the entire procedure. Please see nursing notes for vital signs. Test dose was given through epidural catheter and negative prior to continuing to dose epidural or start infusion. Warning signs of high block given to the patient including shortness of breath,  tingling/numbness in hands, complete motor block, or any concerning symptoms with instructions to call for help. Patient was given instructions on fall risk and not to get out of bed. All questions and concerns addressed with instructions to call with any issues or inadequate analgesia.   ? ?Reason for block:procedure for pain ? ? ? ?

## 2021-05-14 NOTE — Discharge Summary (Signed)
? ?  Postpartum Discharge Summary ? ? ? ?   ?Patient Name: Danielle Singleton ?DOB: 1995-12-14 ?MRN: [id number] ? ?Date of admission: 05/13/2021 ?Delivery date:05/14/2021  ?Delivering provider: Donnamae Jude  ?Date of discharge: 05/15/2021 ? ?Admitting diagnosis: Breech presentation [O32.1XX0] ?Intrauterine pregnancy: [redacted]w[redacted]d     ?Secondary diagnosis:  Principal Problem: ?  VBAC (vaginal birth after Cesarean) ?Active Problems: ?  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  Supervision of high risk pregnancy, antepartum ?  Hx successful VBAC (vaginal birth after cesarean), currently pregnant ?  Small for gestational age ?  Breech delivery ?  Encounter for IUD insertion ? ?Additional problems: GHTN    ?Discharge diagnosis: Term Pregnancy Delivered                                              ?Post partum procedures: Post-placental Mirena IUD placement  ?Augmentation: Pitocin ?Complications: None ? ?Hospital course: Induction of Labor With Vaginal Delivery   ?26 y.o. yo [id number] at [redacted]w[redacted]d was admitted to the hospital 05/13/2021 for induction of labor.  Indication for induction: eIOL/TOLAC for planned vaginal breech delivery.  Patient had an uncomplicated labor course and progressed to complete after Pitocin and SROM. She had an uncomplicated VBAC, breech delivery.  ?Membrane Rupture Time/Date: 1:13 AM ,05/14/2021   ?Delivery Method:VBAC, Spontaneous  ?Episiotomy: None  ?Lacerations:  None  ?Details of delivery can be found in separate delivery note.  Patient developed GHTN intrapartum and was started on Procardia and lasix. She is eating, drinking, voiding, and ambulating without issue. Patient is discharged home 05/15/21. ? ?Newborn Data: ?Birth date:05/14/2021  ?Birth time:3:23 AM  ?Gender:Female  ?Living status:Living  ?Apgars:8 ,9  ?TFTDDU:2025 g  ? ?Magnesium Sulfate received: No ?BMZ received: No ?Rhophylac: N/A ?MMR: N/A ?T-DaP: Given prenatally ?Flu: Given prenatally  ?Transfusion: No  ? ?Physical exam   ?Vitals:  ? 05/14/21 1900 05/14/21 2213 05/15/21 0100 05/15/21 0500  ?BP: 132/81 133/76  139/83  ?Pulse: 78 72  80  ?Resp: $Remov'18 19  18  'zMPsyf$ ?Temp: 98.2 ?F (36.8 ?C) (!) 97.5 ?F (36.4 ?C)  98 ?F (36.7 ?C)  ?TempSrc: Oral Oral  Oral  ?SpO2: 100% 100% 100% 99%  ?Weight:      ?Height:      ? ?General: alert, cooperative, and no distress ?Lochia: appropriate ?Uterine Fundus: firm ?DVT Evaluation: No evidence of DVT seen on physical exam. ?Negative Homan's sign. ?No cords or calf tenderness. ? ?Labs: ?Lab Results  ?Component Value Date  ? WBC 7.9 05/14/2021  ? HGB 10.5 (L) 05/14/2021  ? HCT 31.0 (L) 05/14/2021  ? MCV 94.8 05/14/2021  ? PLT 150 05/14/2021  ? ? ?  Latest Ref Rng & Units 05/14/2021  ?  2:48 AM  ?CMP  ?Glucose 70 - 99 mg/dL 109    ?BUN 6 - 20 mg/dL 5    ?Creatinine 0.44 - 1.00 mg/dL 0.47    ?Sodium 135 - 145 mmol/L 137    ?Potassium 3.5 - 5.1 mmol/L 3.3    ?Chloride 98 - 111 mmol/L 110    ?CO2 22 - 32 mmol/L 20    ?Calcium 8.9 - 10.3 mg/dL 8.4    ?Total Protein 6.5 - 8.1 g/dL 5.6    ?Total Bilirubin 0.3 - 1.2 mg/dL 0.2    ?Alkaline Phos 38 - 126 U/L 88    ?  AST 15 - 41 U/L 29    ?ALT 0 - 44 U/L 24    ? ?Edinburgh Score: ? ?  05/14/2021  ?  8:00 PM  ?Edinburgh Postnatal Depression Scale Screening Tool  ?I have been able to laugh and see the funny side of things. 0  ?I have looked forward with enjoyment to things. 0  ?I have blamed myself unnecessarily when things went wrong. 0  ?I have been anxious or worried for no good reason. 0  ?I have felt scared or panicky for no good reason. 0  ?Things have been getting on top of me. 0  ?I have been so unhappy that I have had difficulty sleeping. 0  ?I have felt sad or miserable. 0  ?I have been so unhappy that I have been crying. 0  ?The thought of harming myself has occurred to me. 0  ?Edinburgh Postnatal Depression Scale Total 0  ? ? ? ?After visit meds:  ?Allergies as of 05/15/2021   ?No Known Allergies ?  ? ?  ?Medication List  ?  ? ?STOP taking these medications   ? ?aspirin  EC 81 MG tablet ?  ?Blood Pressure Kit Devi ?  ?calcium carbonate 500 MG chewable tablet ?Commonly known as: TUMS - dosed in mg elemental calcium ?  ?Gojji Weight Scale Misc ?  ? ?  ? ?TAKE these medications   ? ?acetaminophen 325 MG tablet ?Commonly known as: Tylenol ?Take 2 tablets (650 mg total) by mouth every 4 (four) hours as needed (for pain scale < 4). ?  ?furosemide 20 MG tablet ?Commonly known as: LASIX ?Take 1 tablet (20 mg total) by mouth daily. ?  ?ibuprofen 600 MG tablet ?Commonly known as: ADVIL ?Take 1 tablet (600 mg total) by mouth every 6 (six) hours. ?  ?multivitamin-prenatal 27-0.8 MG Tabs tablet ?Take 1 tablet by mouth daily. ?  ?NIFEdipine 30 MG 24 hr tablet ?Commonly known as: ADALAT CC ?Take 1 tablet (30 mg total) by mouth daily. ?  ? ?  ? ? ? ?Discharge home in stable condition ?Infant Feeding: Breast ?Infant Disposition:home with mother ?Discharge instruction: per After Visit Summary and Postpartum booklet. ?Activity: Advance as tolerated. Pelvic rest for 6 weeks.  ?Diet: routine diet ?Future Appointments:No future appointments. ?Follow up Visit: ?Message sent to [facility] by Dr. Gwenlyn Perking on 05/14/21.  ? ?Please schedule this patient for a In person postpartum visit in 6 weeks with the following provider: Any provider. ?Additional Postpartum F/U: BP check 1 week  ?High risk pregnancy complicated by: Hx of CS, gHTN, breech presentation  ?Delivery mode:  VBAC, Spontaneous  ?Anticipated Birth Control:  PP IUD placed ? ?05/15/2021 ?Christin Fudge, CNM ? ? ? ?

## 2021-05-14 NOTE — Progress Notes (Signed)
Patient ID: Acsa Estey, female   DOB: 1996/01/07, 26 y.o.   MRN: [id number] ? ?Pt noticed ctx that woke her up, and when she went to the bathroom they wouldn't stop, then had SROM at 0113 ? ?FHR 140s (just placed back on monitor) ?Cx 8/double footling with particulate MSF ? ?IUP'@39'$ .5wks ?TOLAC ?Active labor/ROM ?Double footling presentation ? ?Dr Kennon Rounds aware and is on her way  ?Pt desires epidural placement  ? ?Myrtis Ser CNM ?05/14/2021 ?1:25 AM ? ? ? ?

## 2021-05-14 NOTE — Anesthesia Postprocedure Evaluation (Signed)
Anesthesia Post Note ? ?Patient: Danielle Singleton ? ?Procedure(s) Performed: AN AD HOC LABOR EPIDURAL ? ?  ? ?Patient location during evaluation: Mother Baby ?Anesthesia Type: Epidural ?Level of consciousness: awake ?Pain management: satisfactory to patient ?Vital Signs Assessment: post-procedure vital signs reviewed and stable ?Respiratory status: spontaneous breathing ?Cardiovascular status: stable ?Anesthetic complications: no ? ? ?No notable events documented. ? ?Last Vitals:  ?Vitals:  ? 05/14/21 0630 05/14/21 1015  ?BP: 132/62 124/71  ?Pulse: 72 94  ?Resp: 18 17  ?Temp: 36.7 ?C 37.3 ?C  ?SpO2: 100%   ?  ?Last Pain:  ?Vitals:  ? 05/14/21 1122  ?TempSrc:   ?PainSc: 0-No pain  ? ?Pain Goal:   ? ?  ?  ?  ?  ?  ?  ?  ? ?[name] ? ? ? ? ?

## 2021-05-14 NOTE — Anesthesia Preprocedure Evaluation (Addendum)
Anesthesia Evaluation  ?Patient identified by MRN, date of birth, ID band ?Patient awake ? ? ? ?Reviewed: ?Allergy & Precautions, Patient's Chart, lab work & pertinent test results ? ?Airway ?Mallampati: II ? ? ? ? ? ? Dental ?  ?Pulmonary ?neg pulmonary ROS,  ?  ?Pulmonary exam normal ? ? ? ? ? ? ? Cardiovascular ?hypertension, Normal cardiovascular exam ? ? ?  ?Neuro/Psych ?negative neurological ROS ?   ? GI/Hepatic ?  ?Endo/Other  ? ? Renal/GU ?  ? ?  ?Musculoskeletal ? ? Abdominal ?(+) + obese,   ?Peds ? Hematology ?  ?Anesthesia Other Findings ? ? Reproductive/Obstetrics ?(+) Pregnancy ? ?  ? ? ? ? ? ? ? ? ? ? ? ? ? ?  ?  ? ? ? ? ? ? ? ?Anesthesia Physical ?Anesthesia Plan ? ?ASA: 2 ? ?Anesthesia Plan: Epidural  ? ?Post-op Pain Management:   ? ?Induction:  ? ?PONV Risk Score and Plan: 0 ? ?Airway Management Planned:  ? ?Additional Equipment: None ? ?Intra-op Plan:  ? ?Post-operative Plan:  ? ?Informed Consent: I have reviewed the patients History and Physical, chart, labs and discussed the procedure including the risks, benefits and alternatives for the proposed anesthesia with the patient or authorized representative who has indicated his/her understanding and acceptance.  ? ? ? ? ? ?Plan Discussed with:  ? ?Anesthesia Plan Comments: (Lab Results ?     Component                Value               Date                 ?     WBC                      8.4                 05/13/2021           ?     HGB                      11.0 (L)            05/13/2021           ?     HCT                      31.8 (L)            05/13/2021           ?     MCV                      94.6                05/13/2021           ?     PLT                      157                 05/13/2021           ?)  ? ? ? ? ? ?Anesthesia Quick Evaluation ? ?

## 2021-05-15 ENCOUNTER — Other Ambulatory Visit (HOSPITAL_COMMUNITY): Payer: Self-pay

## 2021-05-15 DIAGNOSIS — O139 Gestational [pregnancy-induced] hypertension without significant proteinuria, unspecified trimester: Secondary | ICD-10-CM

## 2021-05-15 HISTORY — DX: Gestational (pregnancy-induced) hypertension without significant proteinuria, unspecified trimester: O13.9

## 2021-05-15 MED ORDER — NIFEDIPINE ER OSMOTIC RELEASE 30 MG PO TB24
30.0000 mg | ORAL_TABLET | Freq: Every day | ORAL | Status: DC
  Administered 2021-05-15: 30 mg via ORAL
  Filled 2021-05-15: qty 1

## 2021-05-15 MED ORDER — FUROSEMIDE 20 MG PO TABS
20.0000 mg | ORAL_TABLET | Freq: Every day | ORAL | Status: DC
  Administered 2021-05-15: 20 mg via ORAL
  Filled 2021-05-15: qty 1

## 2021-05-15 MED ORDER — FUROSEMIDE 20 MG PO TABS
20.0000 mg | ORAL_TABLET | Freq: Every day | ORAL | 0 refills | Status: DC
  Filled 2021-05-15: qty 4, 4d supply, fill #0

## 2021-05-15 MED ORDER — ACETAMINOPHEN 325 MG PO TABS: 650.0000 mg | ORAL_TABLET | ORAL | Status: DC | PRN

## 2021-05-15 MED ORDER — NIFEDIPINE ER 30 MG PO TB24
30.0000 mg | ORAL_TABLET | Freq: Every day | ORAL | 0 refills | Status: DC
  Filled 2021-05-15: qty 30, 30d supply, fill #0

## 2021-05-15 MED ORDER — IBUPROFEN 600 MG PO TABS
600.0000 mg | ORAL_TABLET | Freq: Four times a day (QID) | ORAL | 0 refills | Status: DC
  Filled 2021-05-15: qty 30, 8d supply, fill #0

## 2021-05-21 ENCOUNTER — Ambulatory Visit (INDEPENDENT_AMBULATORY_CARE_PROVIDER_SITE_OTHER): Payer: Medicaid Other

## 2021-05-21 VITALS — BP 133/80 | HR 86 | Wt 190.9 lb

## 2021-05-21 DIAGNOSIS — Z0130 Encounter for examination of blood pressure without abnormal findings: Secondary | ICD-10-CM

## 2021-05-21 NOTE — Progress Notes (Signed)
Blood Pressure Check Visit ? ?Danielle Singleton is here for blood pressure check following spontaneous vaginal on 05/14/21. BP today is 133/80. Patient denies any symptoms of elevated BP. Currently taking Nifedipine 30 mg daily; last taken yesterday at 10 AM. Reports good control of pain. Having daily BM. Some issues with latch during breastfeeding, will schedule with lactation consultant during check out.  ? ?Danielle Howells, RN ?05/21/2021  9:31 AM ? ?

## 2021-05-29 ENCOUNTER — Encounter: Payer: Self-pay | Admitting: Family Medicine

## 2021-06-18 ENCOUNTER — Encounter: Payer: Medicaid Other | Admitting: Family Medicine

## 2021-06-18 DIAGNOSIS — Z30431 Encounter for routine checking of intrauterine contraceptive device: Secondary | ICD-10-CM

## 2021-06-19 NOTE — Progress Notes (Signed)
This encounter was created in error - please disregard.

## 2021-06-25 ENCOUNTER — Ambulatory Visit (INDEPENDENT_AMBULATORY_CARE_PROVIDER_SITE_OTHER): Payer: Medicaid Other | Admitting: Obstetrics and Gynecology

## 2021-06-25 NOTE — Progress Notes (Signed)
    Post Partum Visit Note  Danielle Singleton is a 26 y.o. [id number] 4/6 VBAC/intact perineum with immediate PP Liletta IUD placement at 39/5 weeks.   Patient induced due to borderline FGR. She also had gHTN and she stopped her procardia xl 30 10 days ago per our instructions. Anesthesia: epidural. Postpartum course has been going well per patient. Baby is doing well per mother. Baby is feeding by bottle - Enfamil Nutramigen. Bleeding staining only. Bowel function is abnormal: patient has complaint of constipation . I recommended over the counter stool softener. Bladder function is normal. Patient is sexually active and no problems. Contraception method is IUD. Postpartum depression screening: negative.     Edinburgh Postnatal Depression Scale - 06/25/21 1558       Edinburgh Postnatal Depression Scale:  In the Past 7 Days   I have been able to laugh and see the funny side of things. 0    I have looked forward with enjoyment to things. 0    I have blamed myself unnecessarily when things went wrong. 0    I have been anxious or worried for no good reason. 0    I have felt scared or panicky for no good reason. 0    Things have been getting on top of me. 0    I have been so unhappy that I have had difficulty sleeping. 0    I have felt sad or miserable. 0    I have been so unhappy that I have been crying. 0    The thought of harming myself has occurred to me. 0    Edinburgh Postnatal Depression Scale Total 0             Review of Systems Pertinent items noted in HPI and remainder of comprehensive ROS otherwise negative.  Objective:  BP 115/72   Pulse 65   Wt 187 lb 6.4 oz (85 kg)   LMP 08/09/2020   Breastfeeding No   BMI 35.41 kg/m    NAD EGBUS wnl Vagina: scant old blood in the vault Cervix: wnl. IUD strings 7-8cm and cut to 3-4cm Abdomen: soft, nttp, nd  Assessment:    Norma postpartum exam.   Plan:   *PP: routine care. I told her that if spotting doesn't stop by end of  June to let us know. D/w her need for routine pap smear 12/2021 *GHTN: BPs look great on no meds.   RTC: late 2023 for annual and pap  Aletha Halim, [place] for [facility], [place]

## 2021-06-28 ENCOUNTER — Encounter: Payer: Self-pay | Admitting: Obstetrics and Gynecology

## 2022-06-03 DIAGNOSIS — Z139 Encounter for screening, unspecified: Secondary | ICD-10-CM

## 2022-06-12 NOTE — Congregational Nurse Program (Signed)
Discussed her results of health screening,  Given gift card for attending.   Juliann Pulse, RN, Congregational Nurse, [phone].

## 2022-06-14 LAB — GLUCOSE, POCT (MANUAL RESULT ENTRY): POC Glucose: 102 mg/dl — AB (ref 70–99)

## 2022-06-14 NOTE — Congregational Nurse Program (Signed)
Seen at [facility] Attended [facility], BP and BS taken.  Needs pcp.    Barry Dienes,  Congregational Nurse, [phone]

## 2022-08-27 ENCOUNTER — Encounter (HOSPITAL_COMMUNITY): Payer: Self-pay | Admitting: Emergency Medicine

## 2022-08-27 ENCOUNTER — Emergency Department (HOSPITAL_COMMUNITY): Payer: Medicaid Other

## 2022-08-27 ENCOUNTER — Emergency Department (HOSPITAL_COMMUNITY)
Admission: EM | Admit: 2022-08-27 | Discharge: 2022-08-27 | Disposition: A | Discharge status: Home / Self Care | Payer: Medicaid Other | Attending: Emergency Medicine | Admitting: Emergency Medicine

## 2022-08-27 DIAGNOSIS — R079 Chest pain, unspecified: Secondary | ICD-10-CM | POA: Insufficient documentation

## 2022-08-27 LAB — CBC WITH DIFFERENTIAL/PLATELET
Abs Immature Granulocytes: 0.02 10*3/uL (ref 0.00–0.07)
Basophils Absolute: 0 10*3/uL (ref 0.0–0.1)
Basophils Relative: 0 %
Eosinophils Absolute: 0.2 10*3/uL (ref 0.0–0.5)
Eosinophils Relative: 2 %
HCT: 39.1 % (ref 36.0–46.0)
Hemoglobin: 12.9 g/dL (ref 12.0–15.0)
Immature Granulocytes: 0 %
Lymphocytes Relative: 37 %
Lymphs Abs: 3.5 10*3/uL (ref 0.7–4.0)
MCH: 31.8 pg (ref 26.0–34.0)
MCHC: 33 g/dL (ref 30.0–36.0)
MCV: 96.3 fL (ref 80.0–100.0)
Monocytes Absolute: 0.6 10*3/uL (ref 0.1–1.0)
Monocytes Relative: 6 %
Neutro Abs: 5.2 10*3/uL (ref 1.7–7.7)
Neutrophils Relative %: 55 %
Platelets: 216 10*3/uL (ref 150–400)
RBC: 4.06 MIL/uL (ref 3.87–5.11)
RDW: 12.4 % (ref 11.5–15.5)
WBC: 9.5 10*3/uL (ref 4.0–10.5)
nRBC: 0 % (ref 0.0–0.2)

## 2022-08-27 LAB — TROPONIN I (HIGH SENSITIVITY)
Troponin I (High Sensitivity): <2 ng/L (ref <18)
Troponin I (High Sensitivity): <2 ng/L (ref <18)

## 2022-08-27 LAB — COMPREHENSIVE METABOLIC PANEL
ALT: 18 U/L (ref 0–44)
AST: 15 U/L (ref 15–41)
Albumin: 4.2 g/dL (ref 3.5–5.0)
Alkaline Phosphatase: 60 U/L (ref 38–126)
Anion gap: 7 (ref 5–15)
BUN: 21 mg/dL — ABNORMAL HIGH (ref 6–20)
CO2: 27 mmol/L (ref 22–32)
Calcium: 9.1 mg/dL (ref 8.9–10.3)
Chloride: 101 mmol/L (ref 98–111)
Creatinine, Ser: 0.53 mg/dL (ref 0.44–1.00)
GFR, Estimated: >60 mL/min (ref >60)
Glucose, Bld: 103 mg/dL — ABNORMAL HIGH (ref 70–99)
Potassium: 3.6 mmol/L (ref 3.5–5.1)
Sodium: 135 mmol/L (ref 135–145)
Total Bilirubin: 0.6 mg/dL (ref 0.3–1.2)
Total Protein: 7.8 g/dL (ref 6.5–8.1)

## 2022-08-27 MED ORDER — LIDOCAINE VISCOUS HCL 2 % MT SOLN
15.0000 mL | Freq: Once | OROMUCOSAL | Status: AC
  Administered 2022-08-27: 15 mL via OROMUCOSAL

## 2022-08-27 MED ORDER — ALUM & MAG HYDROXIDE-SIMETH 200-200-20 MG/5ML PO SUSP
ORAL | Status: AC
  Filled 2022-08-27: qty 30

## 2022-08-27 MED ORDER — ALUM & MAG HYDROXIDE-SIMETH 200-200-20 MG/5ML PO SUSP
30.0000 mL | Freq: Once | ORAL | Status: AC
  Administered 2022-08-27: 30 mL via ORAL

## 2022-08-27 MED ORDER — FAMOTIDINE 20 MG PO TABS: 20.0000 mg | ORAL_TABLET | Freq: Every day | ORAL | 0 refills | Status: DC

## 2022-08-27 NOTE — Discharge Instructions (Signed)
Your cardiac testing today was normal. We suspect you may have some acid reflux. Try the pepcid to see if this helps. Watch your intake of spicy/acidic foods which can worsen this. Please follow-up with your doctor. Return here for new concerns.

## 2022-08-27 NOTE — ED Provider Notes (Signed)
[place] EMERGENCY DEPARTMENT AT [facility] Provider Note   CSN: [id number] Arrival date & time: 08/27/22  0013     History  Chief Complaint  Patient presents with   Chest Pain    Danielle Singleton is a 27 y.o. female.  The history is provided by the patient and medical records.  Chest Pain  27 y.o. F here with chest pain.  States mid-sternal in nature, described as burning all the way up into her throat.  Present upon waking this morning, ongoing all day today.  Denies any SOB, diaphoresis, nausea, or vomiting.  She did eat and drink fine today, nothing spicy or acidic.  She has had issues like this before but never lasted this long.  No prior cardiac hx.  She is not a smoker.  Home Medications Prior to Admission medications   Medication Sig Start Date End Date Taking? Authorizing Provider  levonorgestrel (LILETTA, 52 MG,) 20.1 MCG/DAY IUD 1 each by Intrauterine route once. 05/14/21   [provider]  Prenatal Vit-Fe Fumarate-FA (MULTIVITAMIN-PRENATAL) 27-0.8 MG TABS tablet Take 1 tablet by mouth daily.    [provider]      Allergies    Patient has no known allergies.    Review of Systems   Review of Systems  Cardiovascular:  Positive for chest pain.  All other systems reviewed and are negative.   Physical Exam Updated Vital Signs BP (!) 146/85 (BP Location: Right Arm)   Pulse (!) 52   Temp 98.1 F (36.7 C) (Oral)   Resp 16   LMP  (LMP Unknown)   SpO2 100%   Physical Exam Vitals and nursing note reviewed.  Constitutional:      Appearance: She is well-developed.  HENT:     Head: Normocephalic and atraumatic.  Eyes:     Conjunctiva/sclera: Conjunctivae normal.     Pupils: Pupils are equal, round, and reactive to light.  Cardiovascular:     Rate and Rhythm: Normal rate and regular rhythm.     Heart sounds: Normal heart sounds.  Pulmonary:     Effort: Pulmonary effort is normal.     Breath sounds: Normal breath sounds. No wheezing  or rhonchi.     Comments: Lungs CTAB Chest:     Comments: Chest wall non-tender Abdominal:     General: Bowel sounds are normal.     Palpations: Abdomen is soft.     Tenderness: There is no abdominal tenderness. There is no guarding or rebound.  Musculoskeletal:        General: Normal range of motion.     Cervical back: Normal range of motion.  Skin:    General: Skin is warm and dry.  Neurological:     Mental Status: She is alert and oriented to person, place, and time.     ED Results / Procedures / Treatments   Labs (all labs ordered are listed, but only abnormal results are displayed) Labs Reviewed  COMPREHENSIVE METABOLIC PANEL - Abnormal; Notable for the following components:      Result Value   Glucose, Bld 103 (*)    BUN 21 (*)    All other components within normal limits  CBC WITH DIFFERENTIAL/PLATELET  TROPONIN I (HIGH SENSITIVITY)  TROPONIN I (HIGH SENSITIVITY)    EKG None  Radiology No results found.  Procedures Procedures    Medications Ordered in ED Medications  alum & mag hydroxide-simeth (MAALOX/MYLANTA) 200-200-20 MG/5ML suspension 30 mL (has no administration in time range)  lidocaine (  XYLOCAINE) 2 % viscous mouth solution 15 mL (has no administration in time range)    ED Course/ Medical Decision Making/ A&P                             Medical Decision Making Amount and/or Complexity of Data Reviewed Labs: ordered. Radiology: ordered and independent interpretation performed. ECG/medicine tests: ordered and independent interpretation performed.  Risk OTC drugs. Prescription drug management.   27 year old female presenting to the ED with chest pain.  Described as midsternal up to the throat, burning in nature.  She denies any shortness of breath, palpitations, diaphoresis, nausea, or vomiting.  EKG NSR, no acute ischemia.  Labs and CXR pending.  As imaging system down, patient's CXR reviewed in radiology suite-- do not see and acute  findings such as PTX or focal infiltrate.  Labs are reassuring.  Patient's symptoms have resolved after GI cocktail. Suspect likely GERD related. Remains HD stable-- no tachycardia or hypoxia, resting comfortably. She has limited risk factors, doubt ACS, PE, dissection.  Feel she is stable for discharge.  Will start trial of pepcid to see how she responds to this.  Encouraged to follow-up with PCP.  Return here for new concerns.  Final Clinical Impression(s) / ED Diagnoses Final diagnoses:  Chest pain in adult    Rx / DC Orders ED Discharge Orders          Ordered    famotidine (PEPCID) 20 MG tablet  Daily        08/27/22 0538              Garlon Hatchet, PA-C 08/27/22 0550    Nira Conn, MD 08/28/22 [phone]

## 2022-08-27 NOTE — ED Triage Notes (Addendum)
Chest pain radiating into back starting this morning worse with deep inspiration. Took tylenol around 3pm. Lying down makes it worse. Pt denies SOB and smoking, has IUD in place.

## 2022-11-25 ENCOUNTER — Ambulatory Visit: Payer: No Typology Code available for payment source

## 2022-11-25 DIAGNOSIS — Z23 Encounter for immunization: Secondary | ICD-10-CM

## 2022-11-26 NOTE — Congregational Nurse Program (Signed)
Member came for flu vaccine.  Administered.  Attended CMA prep class.  She will need several vaccines.  Job coach will work with her on some job readiness classes.  She was able to access her my chart to view immunizations previously given.  Juliann Pulse, RN Congregational Nurse, [phone]

## 2023-10-28 ENCOUNTER — Encounter (HOSPITAL_COMMUNITY): Payer: Self-pay

## 2023-10-28 ENCOUNTER — Ambulatory Visit (HOSPITAL_COMMUNITY)
Admission: EM | Admit: 2023-10-28 | Discharge: 2023-10-28 | Disposition: A | Discharge status: Home / Self Care | Attending: Emergency Medicine | Admitting: Emergency Medicine

## 2023-10-28 DIAGNOSIS — T161XXA Foreign body in right ear, initial encounter: Secondary | ICD-10-CM | POA: Diagnosis not present

## 2023-10-28 MED ORDER — CIPROFLOXACIN-DEXAMETHASONE 0.3-0.1 % OT SUSP: 4.0000 [drp] | Freq: Two times a day (BID) | OTIC | 0 refills | Status: AC

## 2023-10-28 NOTE — ED Provider Notes (Signed)
 [facility]    CSN: [id number] Arrival date & time: 10/28/23  1929      History   Chief Complaint Chief Complaint  Patient presents with   bug in ear    HPI Danielle Singleton is a 28 y.o. female.  Yesterday felt a bug fly into her right ear She tried to use ear drops and an object to scrape it out Her friend had a mini camera and saw a bug inside  Past Medical History:  Diagnosis Date   Gestational hypertension 05/15/2021   HELLP syndrome 07/16/2015   History of pre-eclampsia in prior pregnancy, currently pregnant    NEVUS 12/11/2007   Qualifier: Diagnosis of  By: Gladis FNP, Nykedtra     PIH (pregnancy induced hypertension), previous postpartum condition    Postpartum hemorrhage 07/16/2015   Small for gestational age 62/19/2023    There are no active problems to display for this patient.   Past Surgical History:  Procedure Laterality Date   CESAREAN SECTION      OB History     Gravida  3   Para  3   Term  2   Preterm  1   AB  0   Living  3      SAB  0   IAB  0   Ectopic  0   Multiple  0   Live Births  3            Home Medications    Prior to Admission medications   Medication Sig Start Date End Date Taking? Authorizing Provider  ciprofloxacin -dexamethasone  (CIPRODEX ) OTIC suspension Place 4 drops into the right ear 2 (two) times daily for 5 days. 10/28/23 11/02/23 Yes Eva Griffo, Asberry, PA-C  levonorgestrel  (LILETTA , 52 MG,) 20.1 MCG/DAY IUD 1 each by Intrauterine route once. 05/14/21   [provider]    Family History Family History  Problem Relation Age of Onset   Diabetes Mother    Hypertension Mother    Diabetes Father    Hypertension Father    CAD Father     Social History Social History   Tobacco Use   Smoking status: Never   Smokeless tobacco: Never  Vaping Use   Vaping status: Never Used  Substance Use Topics   Alcohol use: No   Drug use: No     Allergies   Patient has no known  allergies.   Review of Systems Review of Systems  As per HPI  Physical Exam Triage Vital Signs ED Triage Vitals  Encounter Vitals Group     BP 10/28/23 1952 102/68     Girls Systolic BP Percentile --      Girls Diastolic BP Percentile --      Boys Systolic BP Percentile --      Boys Diastolic BP Percentile --      Pulse Rate 10/28/23 1952 64     Resp 10/28/23 1952 18     Temp 10/28/23 1952 98.6 F (37 C)     Temp Source 10/28/23 1952 Oral     SpO2 10/28/23 1952 98 %     Weight --      Height --      Head Circumference --      Peak Flow --      Pain Score 10/28/23 1953 0     Pain Loc --      Pain Education --      Exclude from Growth Chart --    No data  found.  Updated Vital Signs BP 102/68 (BP Location: Left Arm)   Pulse 64   Temp 98.6 F (37 C) (Oral)   Resp 18   SpO2 98%   Physical Exam Vitals and nursing note reviewed.  Constitutional:      General: She is not in acute distress.    Appearance: Normal appearance.  HENT:     Right Ear: A foreign body is present.     Left Ear: Tympanic membrane and ear canal normal.     Ears:     Comments: Insect noted in right canal     Mouth/Throat:     Pharynx: Oropharynx is clear.  Cardiovascular:     Rate and Rhythm: Normal rate and regular rhythm.     Heart sounds: Normal heart sounds.  Pulmonary:     Effort: Pulmonary effort is normal.     Breath sounds: Normal breath sounds.  Neurological:     Mental Status: She is alert and oriented to person, place, and time.    UC Treatments / Results  Labs (all labs ordered are listed, but only abnormal results are displayed) Labs Reviewed - No data to display  EKG  Radiology No results found.  Procedures Procedures   Medications Ordered in UC Medications - No data to display  Initial Impression / Assessment and Plan / UC Course  I have reviewed the triage vital signs and the nursing notes.  Pertinent labs & imaging results that were available during my  care of the patient were reviewed by me and considered in my medical decision making (see chart for details).  Right ear is irrigated to remove insect Partially successful. There are still small parts of insect noted in the canal and patient is no longer tolerating irrigation.   I have sent CiproDex  to use BID x 5 days, to prevent any otitis externa from canal trauma after foreign body and our attempts to remove.  Advised can return after using these drops and we can try irrigation again.  Have also provided [facility] ENT information.  Agrees to plan, no questions  Final Clinical Impressions(s) / UC Diagnoses   Final diagnoses:  Foreign body of right ear, initial encounter     Discharge Instructions      Please use the CiproDex  ear drops twice daily for 5 days. This is to prevent any infection in the ear canal from irritation (from insect and attempts to remove).  Please return after using these drops and we can try irrigation again. I have also given you the Ear specialist information if needed.     ED Prescriptions     Medication Sig Dispense Auth. Provider   ciprofloxacin -dexamethasone  (CIPRODEX ) OTIC suspension Place 4 drops into the right ear 2 (two) times daily for 5 days. 7.5 mL Alastair Hennes, Asberry, PA-C      PDMP not reviewed this encounter.   Rosland Riding, PA-C 10/28/23 2049

## 2023-10-28 NOTE — Discharge Instructions (Addendum)
 Please use the CiproDex  ear drops twice daily for 5 days. This is to prevent any infection in the ear canal from irritation (from insect and attempts to remove).  Please return after using these drops and we can try irrigation again. I have also given you the Ear specialist information if needed.

## 2023-10-28 NOTE — ED Triage Notes (Signed)
 Pt states felt a bug crawl in her rt ear yesterday morning around 5am. States used ear drops and scrapped some out. States used a micro cameras and the bug is still in there.

## 2023-11-03 ENCOUNTER — Ambulatory Visit: Admitting: Obstetrics & Gynecology

## 2024-02-06 IMAGING — US US MFM FETAL BPP W/O NON-STRESS
1 series · 14 of 28 positions shown · non-contrast
Comparison: none

[Series 1: us mfm fetal bpp w/o non-stress · 33 acquisitions, 14 frames shown]
[im 2/33]
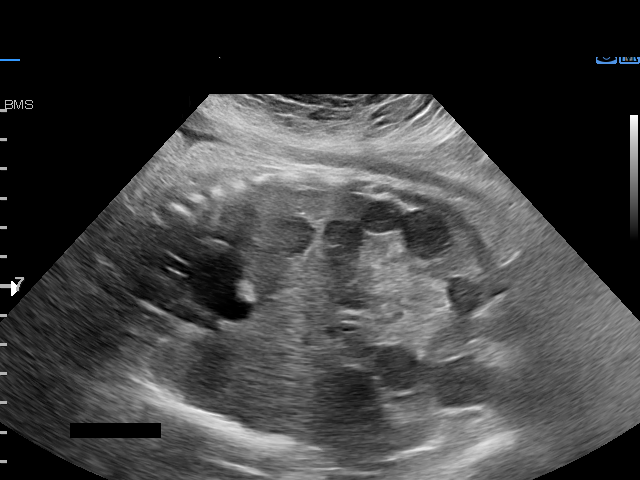
[im 4/33]
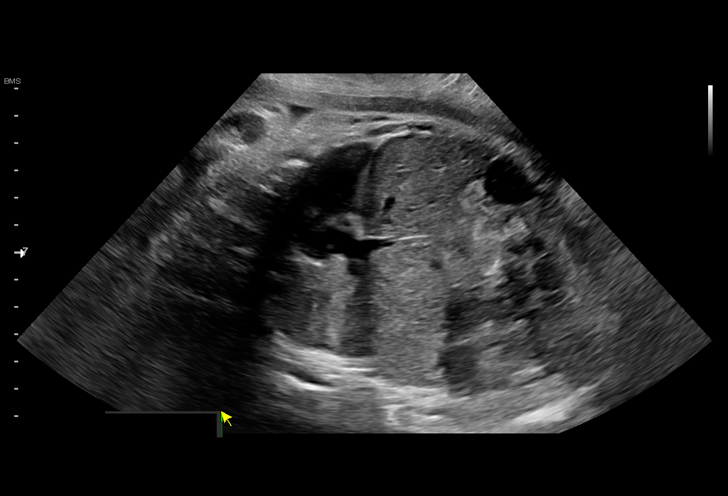
[im 6/33]
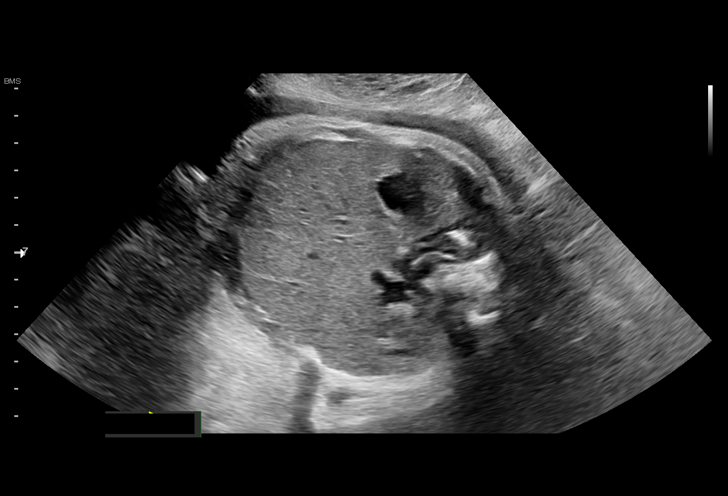
[im 9/33]
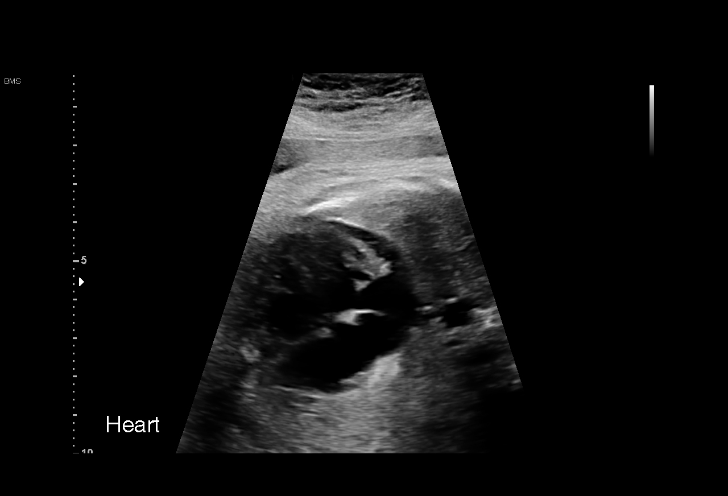
[im 11/33]
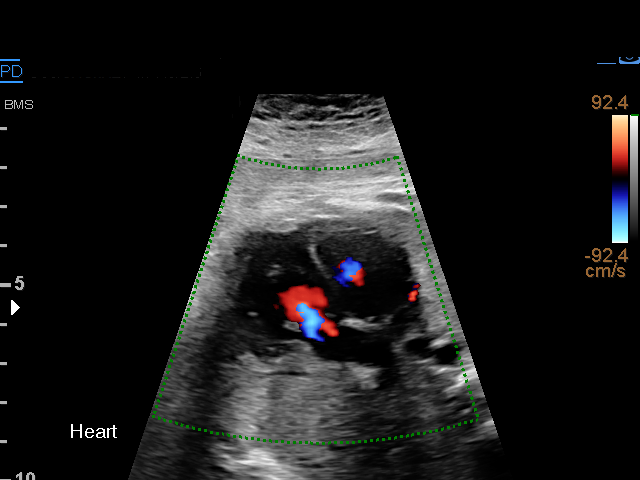
[im 14/33]
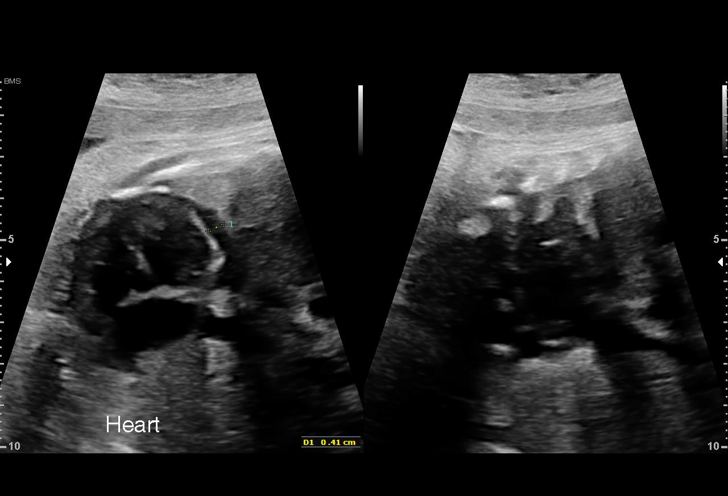
[im 16/33]
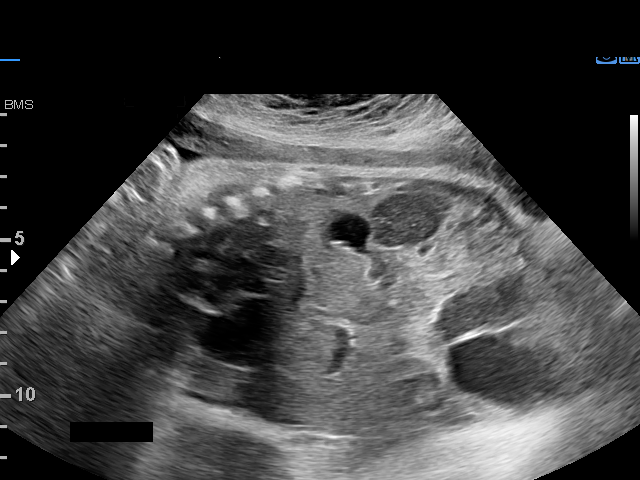
[im 18/33]
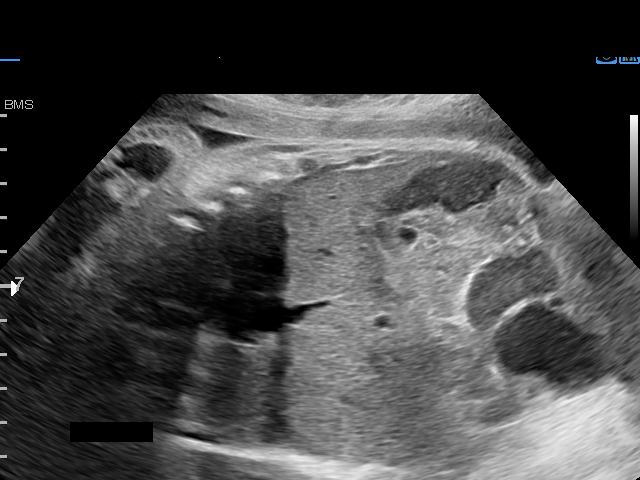
[im 21/33]
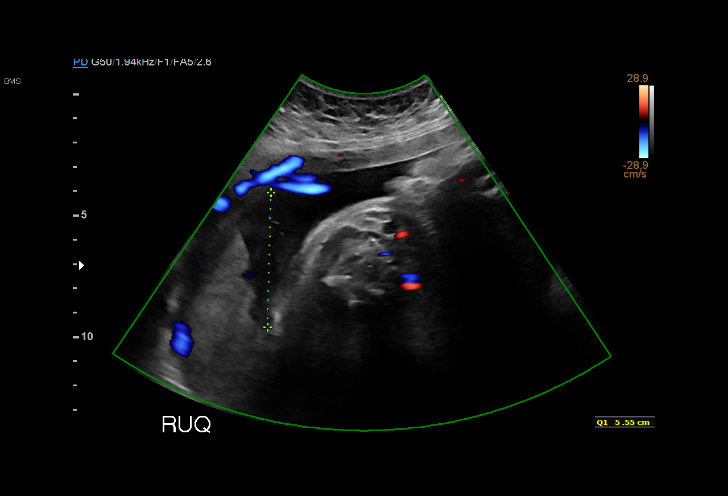
[im 23/33]
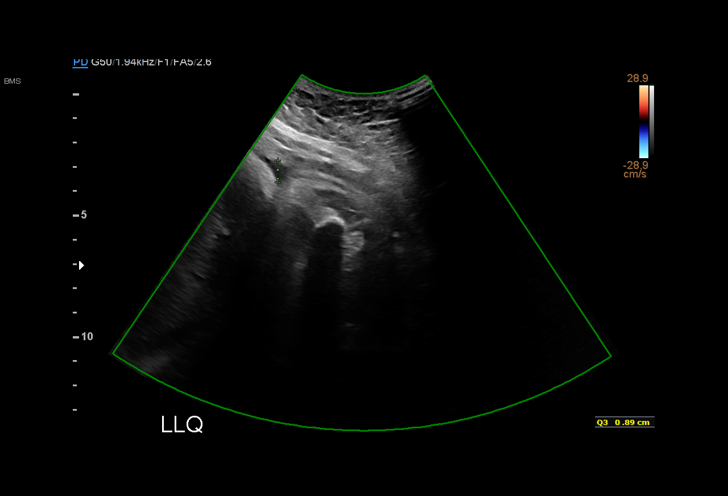
[im 25/33]
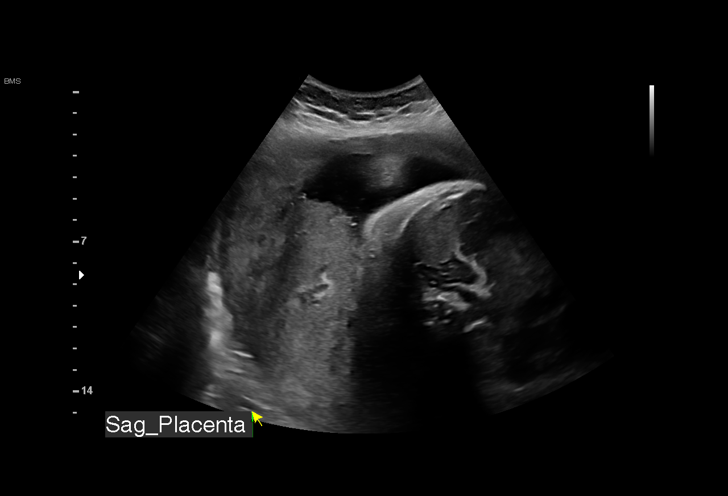
[im 28/33]
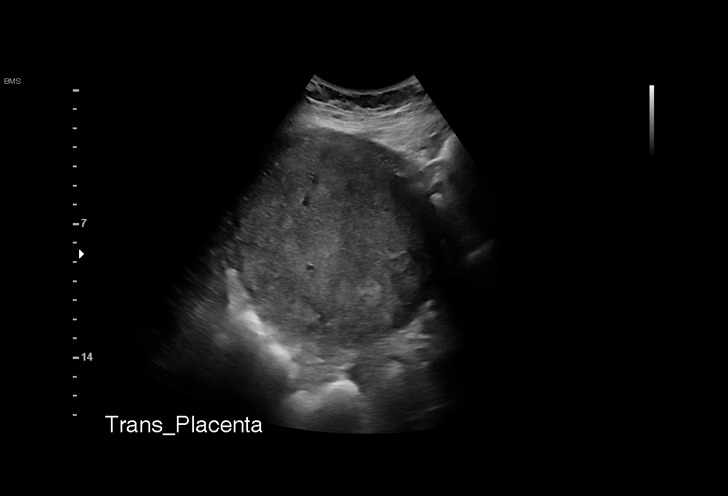
[im 30/33]
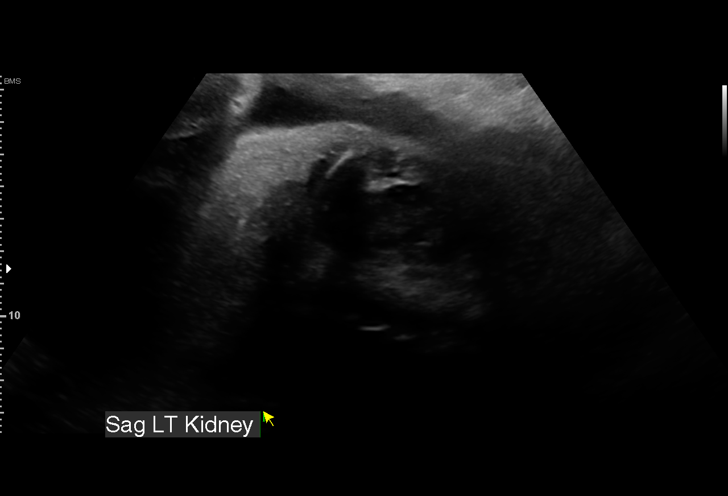
[im 33/33]
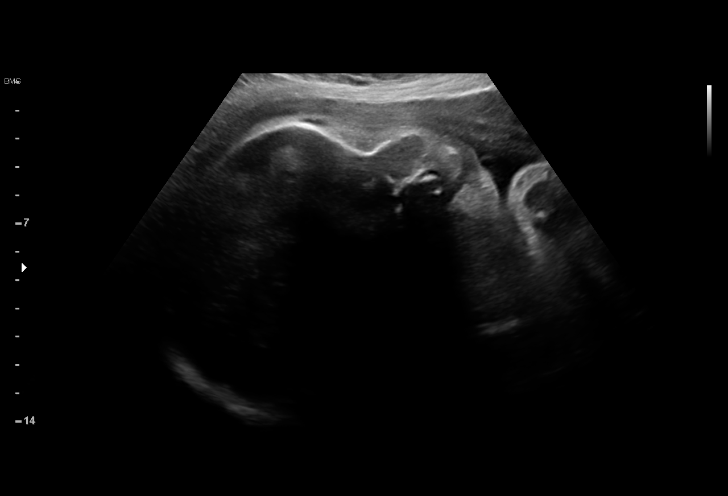

[14 of 28 positions shown; findings below may reference images not displayed]

Indications

 Maternal care for known or suspected poor
 fetal growth, third trimester, not applicable or
 unspecified IUGR
 Poor obstetric history: Previous preterm
 delivery, antepartum (due to SPEC/HELLP)
 Marginal insertion of umbilical cord affecting
 management of mother in third trimester
 History of cesarean delivery, currently
 pregnant
 Poor obstetric history: Previous
 preeclampsia / eclampsia/gestational HTN
 LR NIPS/ Negative AFP
 38 weeks gestation of pregnancy
Fetal Evaluation

 Num Of Fetuses:         1
 Fetal Heart Rate(bpm):  141
 Cardiac Activity:       Observed
 Presentation:           Breech
 P. Cord Insertion:      Marginal insertion prev vis

 Amniotic Fluid
 AFI FV:      Within normal limits

 AFI Sum(cm)     %Tile       Largest Pocket(cm)
 10.09           28
 RUQ(cm)       RLQ(cm)       LUQ(cm)        LLQ(cm)

Biophysical Evaluation

 Amniotic F.V:   Pocket => 2 cm             F. Tone:        Observed
 F. Movement:    Observed                   Score:          [DATE]
 F. Breathing:   Observed
Biometry

 LV:        1.1  mm
OB History

 Gravidity:    3         Term:   1        Prem:   1
Gestational Age

 LMP:           38w 6d        Date:  08/09/20                   EDD:   05/16/21
 Best:          38w 6d     Det. By:  LMP  (08/09/20)          EDD:   05/16/21
Anatomy

 Cranium:               Appears normal         LVOT:                   Previously seen
 Cavum:                 Previously seen        Aortic Arch:            Previously seen
 Ventricles:            Appears normal         Ductal Arch:            Previously seen
 Choroid Plexus:        Previously seen        Diaphragm:              Appears normal
 Cerebellum:            Previously seen        Stomach:                Appears normal, left
                                                                       sided
 Posterior Fossa:       Previously seen        Abdomen:                Previously seen
 Nuchal Fold:           Previously seen        Abdominal Wall:         Previously seen
 Face:                  Orbits and profile     Cord Vessels:           Previously seen
                        previously seen
 Lips:                  Previously seen        Kidneys:                Appear normal
 Palate:                Previously seen        Bladder:                Appears normal
 Thoracic:              Previously seen        Spine:                  Previously seen
 Heart:                 Appears normal         Upper Extremities:      Previously seen
                        (4CH, axis, and
                        situs)
 RVOT:                  Previously seen        Lower Extremities:      Previously seen

 Other:  Fetus previously seen to be female. Heels/feet, open hands/5th digits,
         nasal bone, lenses, VC, 3VV and 3VTV previously visualized.
Impression

 Fetal growth restriction seen at previous scans had
 subsequently resolved.

 External cephalic version was attempted last week but was
 not successful.
 Amniotic fluid is normal good fetal activity seen.  Antenatal
 testing is reassuring.  BPP [DATE].  Breech presentation.
 Patient reports that external cephalic version will be
 attempted before induction/cesarean delivery.

                Tiger, Zeinab

## 2024-02-27 ENCOUNTER — Telehealth: Payer: Self-pay | Admitting: Primary Care

## 2024-02-27 NOTE — Telephone Encounter (Signed)
 02/27/24 Call and confirmed appointment and address

## 2024-02-29 ENCOUNTER — Encounter: Payer: Self-pay | Admitting: Primary Care

## 2024-02-29 ENCOUNTER — Ambulatory Visit (INDEPENDENT_AMBULATORY_CARE_PROVIDER_SITE_OTHER): Admitting: Primary Care

## 2024-02-29 VITALS — BP 114/69 | HR 65 | Temp 98.2°F | Resp 16 | Ht 64.0 in | Wt 191.4 lb

## 2024-02-29 DIAGNOSIS — R7309 Other abnormal glucose: Secondary | ICD-10-CM

## 2024-02-29 DIAGNOSIS — D649 Anemia, unspecified: Secondary | ICD-10-CM

## 2024-02-29 DIAGNOSIS — R635 Abnormal weight gain: Secondary | ICD-10-CM

## 2024-02-29 DIAGNOSIS — N912 Amenorrhea, unspecified: Secondary | ICD-10-CM

## 2024-02-29 DIAGNOSIS — Z7689 Persons encountering health services in other specified circumstances: Secondary | ICD-10-CM

## 2024-02-29 LAB — COMPREHENSIVE METABOLIC PANEL WITH GFR
ALT: 17 IU/L (ref 0–32)
AST: 15 IU/L (ref 0–40)
Albumin: 4.8 g/dL (ref 4.0–5.0)
Alkaline Phosphatase: 71 IU/L (ref 41–116)
BUN/Creatinine Ratio: 16 (ref 9–23)
BUN: 9 mg/dL (ref 6–20)
Bilirubin Total: 0.6 mg/dL (ref 0.0–1.2)
CO2: 21 mmol/L (ref 20–29)
Calcium: 9.3 mg/dL (ref 8.7–10.2)
Chloride: 101 mmol/L (ref 96–106)
Creatinine, Ser: 0.58 mg/dL (ref 0.57–1.00)
Globulin, Total: 2.8 g/dL (ref 1.5–4.5)
Glucose: 83 mg/dL (ref 70–99)
Potassium: 3.8 mmol/L (ref 3.5–5.2)
Sodium: 137 mmol/L (ref 134–144)
Total Protein: 7.6 g/dL (ref 6.0–8.5)
eGFR: 125 mL/min/1.73

## 2024-02-29 NOTE — Addendum Note (Signed)
 Addended by: CASIMIR JUVENAL SAUNDERS on: 02/29/2024 03:08 PM   Modules accepted: Orders

## 2024-02-29 NOTE — Progress Notes (Signed)
 "  New Patient Office Visit  Subjective    Patient ID: Danielle Singleton female  DOB: 01-20-1996  Age: 29 y.o. MRN: [id number]   CC:  Weight lost difficult and would like labs done  HPI     New Patient (Initial Visit)    Additional comments: Establish care  Pt voices concerns about tiredness, lightheadedness, insomnia       Last edited by Casimir Juvenal SAUNDERS, RMA on 02/29/2024  1:27 PM.    Pimple on left Breast  HPI  Danielle Singleton is a 29 year old Hispanic obese female who speaks fluent English in today to establish care.  Medications Ordered Prior to Encounter[1]   Allergies[2]  Past Medical History:  Diagnosis Date   Gestational hypertension 05/15/2021   HELLP syndrome 07/16/2015   History of pre-eclampsia in prior pregnancy, currently pregnant    NEVUS 12/11/2007   Qualifier: Diagnosis of  By: Gladis FNP, Nykedtra     PIH (pregnancy induced hypertension), previous postpartum condition    Postpartum hemorrhage 07/16/2015   Small for gestational age 29/19/2023     Past Surgical History:  Procedure Laterality Date   CESAREAN SECTION       Family History  Problem Relation Age of Onset   Diabetes Mother    Hypertension Mother    Diabetes Father    Hypertension Father    CAD Father     Social History   Socioeconomic History   Marital status: Married    Spouse name: Not on file   Number of children: Not on file   Years of education: Not on file   Highest education level: 10th grade  Occupational History   Not on file  Tobacco Use   Smoking status: Never   Smokeless tobacco: Never  Vaping Use   Vaping status: Never Used  Substance and Sexual Activity   Alcohol use: No   Drug use: No   Sexual activity: Yes  Other Topics Concern   Not on file  Social History Narrative   Not on file   Social Drivers of Health   Tobacco Use: Low Risk (02/29/2024)   Patient History    Smoking Tobacco Use: Never    Smokeless Tobacco Use: Never    Passive Exposure: Not on file   Financial Resource Strain: Not on file  Food Insecurity: No Food Insecurity (02/29/2024)   Epic    Worried About Programme Researcher, Broadcasting/film/video in the Last Year: Never true    Ran Out of Food in the Last Year: Never true  Transportation Needs: No Transportation Needs (02/29/2024)   Epic    Lack of Transportation (Medical): No    Lack of Transportation (Non-Medical): No  Physical Activity: Not on file  Stress: Not on file  Social Connections: Not on file  Intimate Partner Violence: Not At Risk (02/29/2024)   Epic    Fear of Current or Ex-Partner: No    Emotionally Abused: No    Physically Abused: No    Sexually Abused: No  Depression (PHQ2-9): Low Risk (02/29/2024)   Depression (PHQ2-9)    PHQ-2 Score: 0  Alcohol Screen: Not on file  Housing: Low Risk (02/29/2024)   Epic    Unable to Pay for Housing in the Last Year: No    Number of Times Moved in the Last Year: 0    Homeless in the Last Year: No  Utilities: Not At Risk (02/29/2024)   Epic    Threatened with loss of utilities: No  Health Literacy: Not on file    SDOH Interventions Today    Flowsheet Row Most Recent Value  SDOH Interventions   Food Insecurity Interventions Intervention Not Indicated  Housing Interventions Intervention Not Indicated  Transportation Interventions Intervention Not Indicated  Utilities Interventions Intervention Not Indicated     Health Maintenance  Topic Date Due   Hepatitis B Vaccine (1 of 3 - 19+ 3-dose series) Never done   Pap with HPV screening  09/10/2023   COVID-19 Vaccine (1 - 2025-26 season) Never done   Flu Shot  05/08/2024*   DTaP/Tdap/Td vaccine (2 - Td or Tdap) 02/27/2031   HPV Vaccine (No Doses Required) Completed   Hepatitis C Screening  Completed   HIV Screening  Completed   Pneumococcal Vaccine  Aged Out   Meningitis B Vaccine  Aged Out  *Topic was postponed. The date shown is not the original due date.    Objective   BP 114/69   Pulse 65   Temp 98.2 F (36.8 C)   Resp 16    Ht 5' 4 (1.626 m)   Wt 191 lb 6.4 oz (86.8 kg)   SpO2 97%   BMI 32.85 kg/m    Physical Exam Vitals reviewed.  Constitutional:      Appearance: Normal appearance.  HENT:     Head: Normocephalic.     Right Ear: Tympanic membrane, ear canal and external ear normal.     Left Ear: Tympanic membrane, ear canal and external ear normal.     Nose: Nose normal.     Mouth/Throat:     Mouth: Mucous membranes are moist.  Eyes:     Extraocular Movements: Extraocular movements intact.     Pupils: Pupils are equal, round, and reactive to light.  Cardiovascular:     Rate and Rhythm: Normal rate.  Pulmonary:     Effort: Pulmonary effort is normal.     Breath sounds: Normal breath sounds.  Abdominal:     General: Bowel sounds are normal.     Palpations: Abdomen is soft.  Musculoskeletal:        General: Normal range of motion.     Cervical back: Normal range of motion.  Skin:    General: Skin is warm and dry.  Neurological:     Mental Status: She is alert and oriented to person, place, and time.  Psychiatric:        Mood and Affect: Mood normal.        Behavior: Behavior normal.        Thought Content: Thought content normal.      Assessment & Plan:  Danielle Singleton was seen today for new patient (initial visit).  Diagnoses and all orders for this visit:  Encounter to establish care  Anemia, unspecified type -     CBC with Differential -     COMPLETE METABOLIC PANEL WITHOUT GFR  Elevated glucose Mother and father are diabetic  -     Hemoglobin A1c -     CBC with Differential  Weight gain Despite exercise and diet modification -     TSH + free T4  Amenorrhea Secondary to IUD -     CBC with Differential    Follow-up:    The above assessment and management plan was discussed with the patient. The patient verbalized understanding of and has agreed to the management plan. Patient is aware to call the clinic if symptoms fail to improve or worsen. Patient is aware when to return  to  the clinic for a follow-up visit. Patient educated on when it is appropriate to go to the emergency department.   [name], NP-C     [1]  Current Outpatient Medications on File Prior to Visit  Medication Sig Dispense Refill   levonorgestrel  (LILETTA , 52 MG,) 20.1 MCG/DAY IUD 1 each by Intrauterine route once.     No current facility-administered medications on file prior to visit.  [2] No Known Allergies  "

## 2024-03-01 LAB — CBC WITH DIFFERENTIAL/PLATELET
Basophils Absolute: 0 x10E3/uL (ref 0.0–0.2)
Basos: 0 %
EOS (ABSOLUTE): 0.1 x10E3/uL (ref 0.0–0.4)
Eos: 1 %
Hematocrit: 40.3 % (ref 34.0–46.6)
Hemoglobin: 13.5 g/dL (ref 11.1–15.9)
Immature Grans (Abs): 0 x10E3/uL (ref 0.0–0.1)
Immature Granulocytes: 0 %
Lymphocytes Absolute: 2.7 x10E3/uL (ref 0.7–3.1)
Lymphs: 34 %
MCH: 31.8 pg (ref 26.6–33.0)
MCHC: 33.5 g/dL (ref 31.5–35.7)
MCV: 95 fL (ref 79–97)
Monocytes Absolute: 0.5 x10E3/uL (ref 0.1–0.9)
Monocytes: 6 %
Neutrophils Absolute: 4.6 x10E3/uL (ref 1.4–7.0)
Neutrophils: 59 %
Platelets: 233 x10E3/uL (ref 150–450)
RBC: 4.25 x10E6/uL (ref 3.77–5.28)
RDW: 12.5 % (ref 11.7–15.4)
WBC: 7.9 x10E3/uL (ref 3.4–10.8)

## 2024-03-01 LAB — TSH+FREE T4
Free T4: 1.06 ng/dL (ref 0.82–1.77)
TSH: 1.1 u[IU]/mL (ref 0.450–4.500)

## 2024-03-01 LAB — HEMOGLOBIN A1C
Est. average glucose Bld gHb Est-mCnc: 108 mg/dL
Hgb A1c MFr Bld: 5.4 % (ref 4.8–5.6)

## 2024-03-02 ENCOUNTER — Ambulatory Visit (INDEPENDENT_AMBULATORY_CARE_PROVIDER_SITE_OTHER): Payer: Self-pay | Admitting: Primary Care

## 2024-03-06 NOTE — Telephone Encounter (Signed)
 Will forward to provider

## 2024-03-16 ENCOUNTER — Telehealth: Payer: Self-pay | Admitting: Primary Care

## 2024-03-16 NOTE — Telephone Encounter (Signed)
 Spoke to pt about upcoming appt.. Will be present

## 2024-03-19 ENCOUNTER — Ambulatory Visit: Payer: Self-pay | Admitting: Primary Care
